# Patient Record
Sex: Female | Born: 1975 | Race: White | Hispanic: No | Marital: Married | Smoking: Never smoker
Health system: Southern US, Community
[De-identification: ages and names within clinical notes are randomized; demographics above are authoritative.]

## PROBLEM LIST (undated history)

## (undated) DIAGNOSIS — R55 Syncope and collapse: Secondary | ICD-10-CM

## (undated) HISTORY — DX: Syncope and collapse: R55

## (undated) HISTORY — PX: LAPAROSCOPY: SHX197

---

## 1999-04-18 ENCOUNTER — Other Ambulatory Visit: Admission: RE | Admit: 1999-04-18 | Discharge: 1999-04-18 | Payer: Self-pay | Admitting: Obstetrics and Gynecology

## 1999-05-05 ENCOUNTER — Encounter (INDEPENDENT_AMBULATORY_CARE_PROVIDER_SITE_OTHER): Payer: Self-pay | Admitting: Specialist

## 1999-05-05 ENCOUNTER — Other Ambulatory Visit: Admission: RE | Admit: 1999-05-05 | Discharge: 1999-05-05 | Payer: Self-pay | Admitting: Obstetrics and Gynecology

## 1999-11-23 ENCOUNTER — Other Ambulatory Visit: Admission: RE | Admit: 1999-11-23 | Discharge: 1999-11-23 | Payer: Self-pay | Admitting: Obstetrics and Gynecology

## 2000-05-16 ENCOUNTER — Other Ambulatory Visit: Admission: RE | Admit: 2000-05-16 | Discharge: 2000-05-16 | Payer: Self-pay | Admitting: Obstetrics and Gynecology

## 2000-12-24 ENCOUNTER — Other Ambulatory Visit: Admission: RE | Admit: 2000-12-24 | Discharge: 2000-12-24 | Payer: Self-pay | Admitting: Obstetrics and Gynecology

## 2001-06-09 ENCOUNTER — Other Ambulatory Visit: Admission: RE | Admit: 2001-06-09 | Discharge: 2001-06-09 | Payer: Self-pay | Admitting: Obstetrics and Gynecology

## 2002-11-24 ENCOUNTER — Other Ambulatory Visit: Admission: RE | Admit: 2002-11-24 | Discharge: 2002-11-24 | Payer: Self-pay | Admitting: Obstetrics and Gynecology

## 2004-10-03 ENCOUNTER — Other Ambulatory Visit: Admission: RE | Admit: 2004-10-03 | Discharge: 2004-10-03 | Payer: Self-pay | Admitting: Obstetrics and Gynecology

## 2005-07-16 ENCOUNTER — Other Ambulatory Visit: Admission: RE | Admit: 2005-07-16 | Discharge: 2005-07-16 | Payer: Self-pay | Admitting: Obstetrics and Gynecology

## 2008-02-03 ENCOUNTER — Ambulatory Visit: Payer: Self-pay | Admitting: Sports Medicine

## 2008-02-03 DIAGNOSIS — M25569 Pain in unspecified knee: Secondary | ICD-10-CM | POA: Insufficient documentation

## 2008-02-03 DIAGNOSIS — M779 Enthesopathy, unspecified: Secondary | ICD-10-CM | POA: Insufficient documentation

## 2008-02-03 DIAGNOSIS — S92909A Unspecified fracture of unspecified foot, initial encounter for closed fracture: Secondary | ICD-10-CM | POA: Insufficient documentation

## 2008-04-15 ENCOUNTER — Emergency Department (HOSPITAL_COMMUNITY): Admission: EM | Admit: 2008-04-15 | Discharge: 2008-04-15 | Payer: Self-pay | Admitting: Emergency Medicine

## 2009-05-02 ENCOUNTER — Ambulatory Visit (HOSPITAL_COMMUNITY): Admission: RE | Admit: 2009-05-02 | Discharge: 2009-05-02 | Payer: Self-pay | Admitting: Obstetrics and Gynecology

## 2009-06-09 ENCOUNTER — Emergency Department (HOSPITAL_COMMUNITY): Admission: EM | Admit: 2009-06-09 | Discharge: 2009-06-09 | Payer: Self-pay | Admitting: Emergency Medicine

## 2010-02-27 ENCOUNTER — Encounter: Admission: RE | Admit: 2010-02-27 | Discharge: 2010-02-27 | Payer: Self-pay | Admitting: Neurology

## 2010-12-05 ENCOUNTER — Ambulatory Visit
Admission: RE | Admit: 2010-12-05 | Discharge: 2010-12-05 | Payer: Self-pay | Source: Home / Self Care | Attending: Sports Medicine | Admitting: Sports Medicine

## 2010-12-05 DIAGNOSIS — M5417 Radiculopathy, lumbosacral region: Secondary | ICD-10-CM | POA: Insufficient documentation

## 2010-12-05 DIAGNOSIS — Q667 Congenital pes cavus, unspecified foot: Secondary | ICD-10-CM | POA: Insufficient documentation

## 2010-12-13 NOTE — Assessment & Plan Note (Signed)
Summary: RUNNER BACK PAIN/MARATHON IN MARCH/MJD   CC:  back and hip pain.  History of Present Illness: back pain and hip pain x1 month.  previously evaluated for MS due to an episode of numbness down bilateral legs, [facility]ist thought it was unlikely.   THat episode ended and hasn't come back.   This time she is having deep achy pain in lower back and left post hip that seems to radiate both down and up.   currently running 30-32mi/wk training for a marathon.    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    Physical Exam  General:  Well-developed,well-nourished,in no acute distress; alert,appropriate and cooperative throughout examination Msk:  Hips: good strenght in abduction and rotation equal leg length good SI joint mobility pain with FABER on left Pain with piriformis stretch TTP deep over left buttocks no numbness  Feet: mortons foot bilaterally high arch normal post tib firing  good running form there is no limp or abnormal foot turnout   Impression & Recommendations:  Problem # 1:  HIP PAIN, LEFT (ICD-719.45) Assessment New  though pt is worried this is related to her endometriosis, more likely to be related to her running.  Likely a piriformis strain.  Gave stretches and exercises for this.  Also noted mortons toes, high arch.  put green temp inserts in shoes.  Gave back stretches/exercises.  Pt to RTC in 4-6 weeks if no improvement.  Consider u/s of muscles at that point.  Orders: Sports Insoles [phone])  Problem # 2:  TALIPES CAVUS (ICD-754.71)  good candidate to keep in some type of cushioned arch support would only use orthotics if pain pattern too  prominent  Orders: Sports Insoles (U0454)   Orders Added: 1)  Sports Insoles [L3510] 2)  New Patient Level II [99202] 3)  Est. Patient Level IV [09811]

## 2011-01-09 ENCOUNTER — Encounter: Payer: Self-pay | Admitting: *Deleted

## 2011-02-10 LAB — POCT I-STAT, CHEM 8
Calcium, Ion: 1.31 mmol/L (ref 1.12–1.32)
Chloride: 104 mEq/L (ref 96–112)
HCT: 42 % (ref 36.0–46.0)
Hemoglobin: 14.3 g/dL (ref 12.0–15.0)
Potassium: 4.3 mEq/L (ref 3.5–5.1)
Sodium: 140 mEq/L (ref 135–145)

## 2011-02-10 LAB — POCT PREGNANCY, URINE: Preg Test, Ur: NEGATIVE

## 2011-05-03 IMAGING — RF DG HYSTEROGRAM
4 series · 4 of 4 positions shown · non-contrast
Comparison: none

CLINICAL DATA: Infertility.

HYSTEROSALPINGOGRAM
TECHNIQUE: Hysterosalpingogram was performed by the ordering
physician under fluoroscopy.  Fluoroscopic images are submitted for
interpretation following the procedure.
Fluoro time:  0.5 minutes

[Series 1: run · 1 of 1 slices shown (1 of 4)]
[im 1/1]
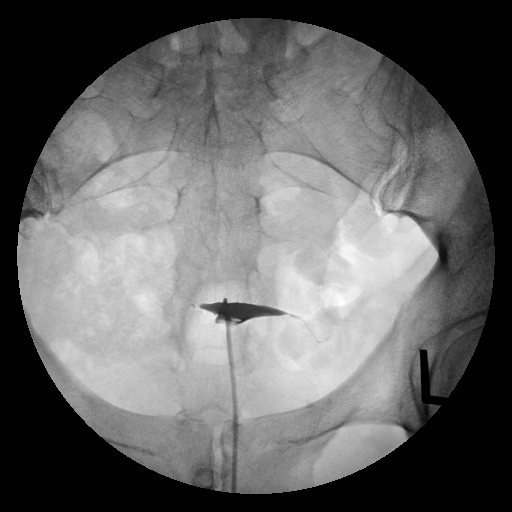

[Series 2: run · 1 of 1 slices shown (2 of 4)]
[im 1/1]
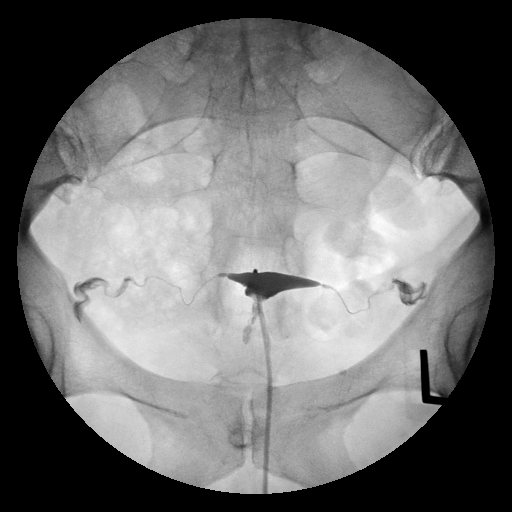

[Series 3: run · 1 of 1 slices shown (3 of 4)]
[im 1/1]
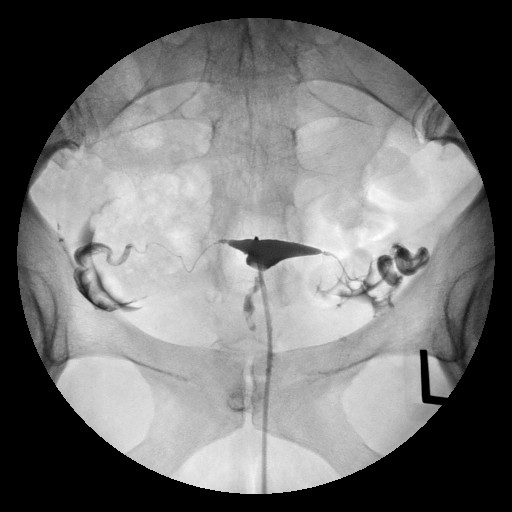

[Series 4: run · 1 of 1 slices shown (4 of 4)]
[im 1/1]
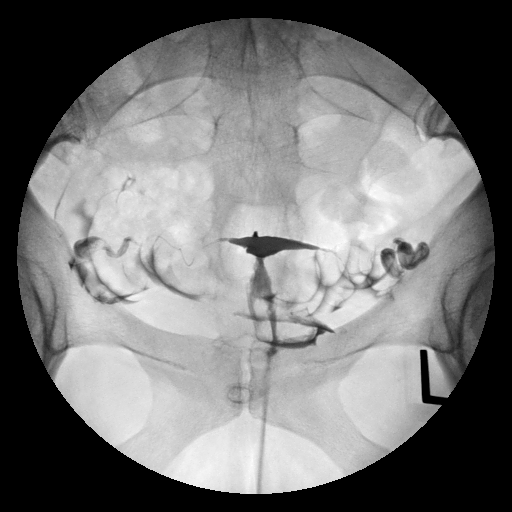

[4 of 4 positions shown; findings below may reference images not displayed]

FINDINGS: The endometrial cavity of the uterus is normal in contour
and appearance.

Contrast filling of both fallopian tubes is seen, and both tubes
are normal in appearance.  Intraperitoneal spill of the contrast
from both fallopian tubes is demonstrated.
IMPRESSION: Normal study.  Fallopian tubes are patent bilaterally.

## 2011-06-05 ENCOUNTER — Encounter: Payer: Self-pay | Admitting: Sports Medicine

## 2011-06-05 ENCOUNTER — Other Ambulatory Visit: Payer: Self-pay | Admitting: Sports Medicine

## 2011-06-05 ENCOUNTER — Ambulatory Visit (INDEPENDENT_AMBULATORY_CARE_PROVIDER_SITE_OTHER): Payer: BC Managed Care – PPO | Admitting: Sports Medicine

## 2011-06-05 VITALS — BP 119/74

## 2011-06-05 DIAGNOSIS — IMO0002 Reserved for concepts with insufficient information to code with codable children: Secondary | ICD-10-CM

## 2011-06-05 DIAGNOSIS — M5417 Radiculopathy, lumbosacral region: Secondary | ICD-10-CM

## 2011-06-05 DIAGNOSIS — M5410 Radiculopathy, site unspecified: Secondary | ICD-10-CM

## 2011-06-05 MED ORDER — GABAPENTIN 300 MG PO CAPS: ORAL_CAPSULE | ORAL | Status: DC

## 2011-06-05 NOTE — Assessment & Plan Note (Addendum)
She's had persistent pain not resolve by stretches and rehabilitation. We'll go ahead and MRI her lumbosacral spine. I will also start Neurontin. She recently finished a prednisone taper for sinus infection, and did not note any improvement in her pain so we'll not repeat the steroids. She's also been through adequate therapy and piriformis stretches without improvement. I will see her back after the MRI is done.

## 2011-06-05 NOTE — Progress Notes (Signed)
  Subjective:    Patient ID: Robyn Wilkins, female    DOB: Jan 18, 1976, 35 y.o.   MRN: [id number]  HPI Exie is a healthy 35 year old female who has had problems with left hip and buttock pain for almost 6 months now. The pain starts in her back and travels to her right buttock and occasionally down the back of her leg to her foot. She also gets some numbness and tingling along the back of her thigh and lower leg. The pain has progressed to the point where it wakes her up at night and she is currently taking Vicodin. She did recently ran a marathon in March, and currently runs 12-15 miles per week. This exacerbates her symptoms. She doesn't note any change in symptoms with bending forward, and has not noted any trauma. She was seen previously diagnosed with a piriformis strain, and has been doing daily rehabilitation stretches and exercises for this. She has not noted any improvement. No change in symptoms with coughing or sneezing.  Of note she did recently finish a course of steroids for a sinus infection, prescribed by another doctor, and has not noticed any improvement in her leg symptoms after this.  Review of Systems No fevers, chills, night sweats, weight loss, bowel or bladder dysfunction.    Objective:   Physical Exam General: Well-developed well-nourished Caucasian female. Back Exam: Inspection: Unremarkable Motion: Flexion 45 deg, Extension 45 deg, Side Bending to 45 deg bilaterally,  Rotation to 45 deg bilaterally SLR laying:  Positive on the left side. XSLR laying: Negative Palpable tenderness: None FABER: negative Adduction stretch test does reproduce some of her symptoms. Sensory change: Gross sensation intact to all lumbar and sacral dermatomes. Reflexes: 2+ and brisk at both patellar tendons, 2+ and brisk at achilles tendons, Babinski's downgoing. Minimal tenderness to palpation at the left sciatic notch. Hip range of motion is 45 of internal/external rotation  bilaterally.  Strength at foot Plantar-flexion: 5/5    Dorsi-flexion: 5/5    Eversion: 5/5   Inversion: 5/5 Leg strength Quad: 5/5   Hamstring: 5/5   Hip flexor: 5/5   Hip abductors: Excellent, 5/5 Gait unremarkable.    Assessment & Plan:   Lumbosacral radiculopathy She's had persistent pain not resolve by stretches and rehabilitation. We'll go ahead and MRI her lumbosacral spine. I will also start Neurontin. She recently finished a prednisone taper for sinus infection, and did not note any improvement in her pain so we'll not repeat the steroids.

## 2011-06-05 NOTE — Patient Instructions (Addendum)
Great to see you, Your appt for the MRI is on Wednesday, aug 8th at 8am at [facility] hospital. Register in admitting at 7:30am. [phone] I will also call in Neurontin. Make an appointment to come back to see [place] after her MRI is done and we can discuss the results.  MRI AUTH # [id number]. APPROVED FOR 30 DAYS

## 2011-06-12 ENCOUNTER — Other Ambulatory Visit: Payer: Self-pay | Admitting: *Deleted

## 2011-06-12 DIAGNOSIS — M5417 Radiculopathy, lumbosacral region: Secondary | ICD-10-CM

## 2011-06-13 ENCOUNTER — Ambulatory Visit (HOSPITAL_COMMUNITY)
Admission: RE | Admit: 2011-06-13 | Discharge: 2011-06-13 | Disposition: A | Discharge status: Home / Self Care | Payer: BC Managed Care – PPO | Source: Ambulatory Visit | Attending: Sports Medicine | Admitting: Sports Medicine

## 2011-06-13 ENCOUNTER — Inpatient Hospital Stay (HOSPITAL_COMMUNITY): Admission: RE | Admit: 2011-06-13 | Payer: BC Managed Care – PPO | Source: Ambulatory Visit

## 2011-06-13 DIAGNOSIS — M545 Low back pain, unspecified: Secondary | ICD-10-CM | POA: Insufficient documentation

## 2011-06-13 DIAGNOSIS — M5417 Radiculopathy, lumbosacral region: Secondary | ICD-10-CM

## 2011-06-13 DIAGNOSIS — M25559 Pain in unspecified hip: Secondary | ICD-10-CM | POA: Insufficient documentation

## 2011-06-21 ENCOUNTER — Ambulatory Visit (INDEPENDENT_AMBULATORY_CARE_PROVIDER_SITE_OTHER): Payer: BC Managed Care – PPO | Admitting: Family Medicine

## 2011-06-21 ENCOUNTER — Encounter: Payer: Self-pay | Admitting: Family Medicine

## 2011-06-21 VITALS — BP 115/74 | HR 65 | Ht 66.0 in | Wt 138.0 lb

## 2011-06-21 DIAGNOSIS — IMO0002 Reserved for concepts with insufficient information to code with codable children: Secondary | ICD-10-CM

## 2011-06-21 DIAGNOSIS — M76899 Other specified enthesopathies of unspecified lower limb, excluding foot: Secondary | ICD-10-CM

## 2011-06-21 DIAGNOSIS — M7062 Trochanteric bursitis, left hip: Secondary | ICD-10-CM

## 2011-06-21 DIAGNOSIS — M5417 Radiculopathy, lumbosacral region: Secondary | ICD-10-CM

## 2011-06-21 DIAGNOSIS — G5700 Lesion of sciatic nerve, unspecified lower limb: Secondary | ICD-10-CM

## 2011-06-21 NOTE — Progress Notes (Signed)
  Subjective:    Patient ID: Robyn Wilkins, female    DOB: 08/26/1976, 35 y.o.   MRN: [id number]  HPI  Robyn Wilkins is a pleasant 35 yo female patient coming today for a F/u on her L spine MRI for  L lumbar radiculopathy. Her MRI showed a mild bulging disc L4-L5 no nerve compression, no spinal stenosis. She states that her radicular symptoms are on and off. Numbness and tingling on and off, improving lately. There is pain on her left gluteal area, radiated to her leg on and off, dull, 4/10, worsen with activities, improve by rest. She has been with this pain for the last 7-8 month. No prior injury. She was given a Rx for neurontin which she did not refill, she wanted to wait for MRI results. She is very active, she works out and exercise frequently. She is doing SI joint stretches. She also is complaining of left posterior mid thigh pain for the last month, she has noticed that her hamstrings are tight  And they burn and hurts when she tries to stretch them. No previous injuries to her hamstring muscle.  Review of Systems  Constitutional: Negative for fever and fatigue.  Musculoskeletal:       Left gluteal pain with HPI  Skin: Negative for color change, pallor and rash.  Neurological: Negative for weakness.       Objective:   Physical Exam  Constitutional: She appears well-developed and well-nourished.       BP 115/74  Pulse 65  Ht 5\' 6"  (1.676 m)  Wt 138 lb (62.596 kg)  BMI 22.27 kg/m2   Eyes: EOM are normal.  Pulmonary/Chest: Effort normal.  Musculoskeletal:       Low back with intact skin. No swelling, no hematomas. FROM for flexion, extension, rotation and lateralization.  No Tenderness to palpation on SI joint area B/L. Faber test negative for SI joint pain. Straight leg raise negative B/L. Strength 5/5 for hip flexion and extension, 5/5 for knee flexion and extension, 5/5 for ankle plantar and dorsal flexion B/L. DTR patellar and achilles II/IV B/L Sensation intact distally  B/L. No leg discrepancy.  Left hip with FROM, TTP in the great  trochanteric area. There is TTP in the left gluteal area over the piriformis path. Abductors strength 5/5 Sensation intact distally.    Neurological: She is alert.  Skin: Skin is warm. No rash noted. No erythema. No pallor.  Psychiatric: She has a normal mood and affect. Judgment normal.   After obtaining consent, the skin of the Left  lateral hip was sterilely prepped with alcohol swabs, ethyl will chloride was used for local topical anesthesia injection of 4 mL 1% lidocaine plus one mL of Kenalog was injected in the left trochanteric bursa. The procedure was well-tolerated by the patient. Patient instructed to remain in clinic for 20 minutes afterwards, and to report any adverse reaction to me immediately.       Assessment & Plan:   1. Lumbosacral radiculopathy   2. Pyriformis syndrome   3. Trochanteric bursitis of left hip    We will see her back in 3 weeks. F/u respond of trochanteric bursa injection Discussed option of neurontin or amytriptiline if she does not improve. Recommended thigh sleeve. Cont SI joint stretches. Cont hamstring stretches. Iliotibial band stretches shown, handout given.

## 2011-07-12 ENCOUNTER — Ambulatory Visit: Payer: BC Managed Care – PPO | Admitting: Family Medicine

## 2011-07-26 ENCOUNTER — Ambulatory Visit (INDEPENDENT_AMBULATORY_CARE_PROVIDER_SITE_OTHER): Payer: BC Managed Care – PPO | Admitting: Family Medicine

## 2011-07-26 VITALS — BP 110/60

## 2011-07-26 DIAGNOSIS — M7062 Trochanteric bursitis, left hip: Secondary | ICD-10-CM

## 2011-07-26 DIAGNOSIS — M76899 Other specified enthesopathies of unspecified lower limb, excluding foot: Secondary | ICD-10-CM

## 2011-07-26 NOTE — Progress Notes (Signed)
  Subjective:    Patient ID: Robyn Wilkins, female    DOB: 1975/11/17, 35 y.o.   MRN: [id number]  HPI  Coming to f/U on her left trochanteric bursitis. She had a cortisone injection done on her left hip which helped her tremendously. She has improve 90%. She is pain free. She has walk with no pain. She went yesterday to the gym to workout without any pain.  Patient Active Problem List  Diagnoses  . Lumbosacral radiculopathy  . TALIPES CAVUS   Current Outpatient Prescriptions on File Prior to Visit  Medication Sig Dispense Refill  . gabapentin (NEURONTIN) 300 MG capsule One tab PO qHS for 3 days, then BID for 3 days, then TID.  90 capsule  1  . LUPRON DEPOT 3.75 MG injection        No Known Allergies   Review of Systems  Constitutional: Negative for fever, chills and fatigue.  Musculoskeletal: Negative for back pain, joint swelling and gait problem.  Neurological: Negative for tremors, seizures, syncope and weakness.       Objective:   Physical Exam  Constitutional: She appears well-developed and well-nourished.       BP 110/60   Eyes: EOM are normal.  Neck: Normal range of motion. Neck supple.  Pulmonary/Chest: Effort normal.  Musculoskeletal:       Left hip with FROM .  No TTP on the trochanteric bursa. No limping. Sensation intact distally  No pain with resisted abduction  Neurological: She is alert.  Skin: Skin is warm. No rash noted. No erythema. No pallor.  Psychiatric: She has a normal mood and affect. Her behavior is normal.          Assessment & Plan:   1. Trochanteric bursitis of left hip   cont. Hip stretches and strengthening exercise. Start low back HEP. F/U PRN

## 2011-08-02 LAB — URINALYSIS, ROUTINE W REFLEX MICROSCOPIC
Glucose, UA: NEGATIVE
Hgb urine dipstick: NEGATIVE
Ketones, ur: 15 — AB
Nitrite: NEGATIVE
Protein, ur: NEGATIVE
Urobilinogen, UA: 0.2
pH: 6

## 2011-08-02 LAB — CBC
HCT: 40.2
Hemoglobin: 13.5
MCV: 89.8
RBC: 4.48

## 2011-08-02 LAB — POCT I-STAT, CHEM 8
BUN: 21
Creatinine, Ser: 1.1
Hemoglobin: 14.3
TCO2: 25

## 2011-08-02 LAB — DIFFERENTIAL
Eosinophils Absolute: 0.1
Eosinophils Relative: 1
Lymphs Abs: 1.7
Monocytes Relative: 10
Neutro Abs: 6.2
Neutrophils Relative %: 70

## 2011-08-02 LAB — POCT CARDIAC MARKERS
CKMB, poc: 1.2
Operator id: 275321
Troponin i, poc: <0.05

## 2011-08-02 LAB — URINE MICROSCOPIC-ADD ON

## 2011-09-07 ENCOUNTER — Encounter (HOSPITAL_COMMUNITY): Payer: Self-pay | Admitting: Anesthesiology

## 2011-09-07 ENCOUNTER — Inpatient Hospital Stay (HOSPITAL_COMMUNITY): Payer: BC Managed Care – PPO

## 2011-09-07 ENCOUNTER — Encounter (HOSPITAL_COMMUNITY)
Admission: AD | Disposition: A | Discharge status: Home / Self Care | Payer: Self-pay | Source: Ambulatory Visit | Attending: Obstetrics and Gynecology

## 2011-09-07 ENCOUNTER — Inpatient Hospital Stay (HOSPITAL_COMMUNITY): Payer: BC Managed Care – PPO | Admitting: Anesthesiology

## 2011-09-07 ENCOUNTER — Ambulatory Visit (HOSPITAL_COMMUNITY)
Admission: AD | Admit: 2011-09-07 | Discharge: 2011-09-07 | Disposition: A | Discharge status: Home / Self Care | Payer: BC Managed Care – PPO | Source: Ambulatory Visit | Attending: Obstetrics and Gynecology | Admitting: Obstetrics and Gynecology

## 2011-09-07 ENCOUNTER — Encounter (HOSPITAL_COMMUNITY): Payer: Self-pay

## 2011-09-07 ENCOUNTER — Other Ambulatory Visit: Payer: Self-pay | Admitting: Obstetrics and Gynecology

## 2011-09-07 DIAGNOSIS — O021 Missed abortion: Principal | ICD-10-CM | POA: Insufficient documentation

## 2011-09-07 HISTORY — PX: DILATION AND EVACUATION: SHX1459

## 2011-09-07 LAB — URINALYSIS, ROUTINE W REFLEX MICROSCOPIC
Bilirubin Urine: NEGATIVE
Nitrite: NEGATIVE
Protein, ur: NEGATIVE mg/dL
Urobilinogen, UA: 0.2 mg/dL (ref 0.0–1.0)
pH: 6 (ref 5.0–8.0)

## 2011-09-07 LAB — CBC
Hemoglobin: 12.4 g/dL (ref 12.0–15.0)
RDW: 12.1 % (ref 11.5–15.5)

## 2011-09-07 LAB — URINE MICROSCOPIC-ADD ON

## 2011-09-07 LAB — POCT PREGNANCY, URINE: Preg Test, Ur: POSITIVE

## 2011-09-07 SURGERY — DILATION AND EVACUATION, UTERUS
Anesthesia: Monitor Anesthesia Care

## 2011-09-07 MED ORDER — CEFAZOLIN SODIUM 1-5 GM-% IV SOLN
1.0000 g | INTRAVENOUS | Status: AC
  Administered 2011-09-07: 1 g via INTRAVENOUS
  Filled 2011-09-07: qty 50

## 2011-09-07 MED ORDER — KETOROLAC TROMETHAMINE 30 MG/ML IJ SOLN: 15.0000 mg | Freq: Once | INTRAMUSCULAR | Status: DC | PRN

## 2011-09-07 MED ORDER — RHO D IMMUNE GLOBULIN 1500 UNIT/2ML IJ SOLN
300.0000 ug | Freq: Once | INTRAMUSCULAR | Status: AC
  Administered 2011-09-07: 300 ug via INTRAMUSCULAR

## 2011-09-07 MED ORDER — KETOROLAC TROMETHAMINE 30 MG/ML IJ SOLN
INTRAMUSCULAR | Status: DC | PRN
  Administered 2011-09-07: 30 mg via INTRAVENOUS

## 2011-09-07 MED ORDER — ONDANSETRON HCL 4 MG/2ML IJ SOLN
INTRAMUSCULAR | Status: DC | PRN
  Administered 2011-09-07: 4 mg via INTRAVENOUS

## 2011-09-07 MED ORDER — PROPOFOL 10 MG/ML IV EMUL
INTRAVENOUS | Status: DC | PRN
  Administered 2011-09-07: 200 ug/kg/min via INTRAVENOUS

## 2011-09-07 MED ORDER — OXYTOCIN 20 UNITS IN LACTATED RINGERS INFUSION - SIMPLE
INTRAVENOUS | Status: DC | PRN
  Administered 2011-09-07: 20 [IU] via INTRAVENOUS

## 2011-09-07 MED ORDER — LACTATED RINGERS IV SOLN
INTRAVENOUS | Status: DC
  Administered 2011-09-07 (×2): via INTRAVENOUS

## 2011-09-07 MED ORDER — KETOROLAC TROMETHAMINE 60 MG/2ML IM SOLN
60.0000 mg | Freq: Once | INTRAMUSCULAR | Status: AC
  Administered 2011-09-07: 60 mg via INTRAMUSCULAR
  Filled 2011-09-07: qty 2

## 2011-09-07 MED ORDER — MIDAZOLAM HCL 5 MG/5ML IJ SOLN
INTRAMUSCULAR | Status: DC | PRN
  Administered 2011-09-07: 2 mg via INTRAVENOUS

## 2011-09-07 MED ORDER — FAMOTIDINE IN NACL 20-0.9 MG/50ML-% IV SOLN
20.0000 mg | Freq: Once | INTRAVENOUS | Status: AC
  Administered 2011-09-07: 20 mg via INTRAVENOUS
  Filled 2011-09-07: qty 50

## 2011-09-07 MED ORDER — METHYLERGONOVINE MALEATE 0.2 MG PO TABS: 0.2000 mg | ORAL_TABLET | Freq: Three times a day (TID) | ORAL | Status: AC

## 2011-09-07 MED ORDER — CITRIC ACID-SODIUM CITRATE 334-500 MG/5ML PO SOLN
30.0000 mL | Freq: Once | ORAL | Status: AC
  Administered 2011-09-07: 30 mL via ORAL
  Filled 2011-09-07: qty 15

## 2011-09-07 MED ORDER — LIDOCAINE HCL (CARDIAC) 20 MG/ML IV SOLN
INTRAVENOUS | Status: DC | PRN
  Administered 2011-09-07: 50 mg via INTRAVENOUS

## 2011-09-07 MED ORDER — FENTANYL CITRATE 0.05 MG/ML IJ SOLN
INTRAMUSCULAR | Status: DC | PRN
  Administered 2011-09-07: 100 ug via INTRAVENOUS

## 2011-09-07 MED ORDER — FENTANYL CITRATE 0.05 MG/ML IJ SOLN: 25.0000 ug | INTRAMUSCULAR | Status: DC | PRN

## 2011-09-07 MED ORDER — LIDOCAINE HCL 1 % IJ SOLN
INTRAMUSCULAR | Status: DC | PRN
  Administered 2011-09-07: 20 mL

## 2011-09-07 MED ORDER — DEXAMETHASONE SODIUM PHOSPHATE 10 MG/ML IJ SOLN
INTRAMUSCULAR | Status: DC | PRN
  Administered 2011-09-07: 10 mg via INTRAVENOUS

## 2011-09-07 SURGICAL SUPPLY — 18 items
CATH ROBINSON RED A/P 16FR (CATHETERS) ×2 IMPLANT
CLOTH BEACON ORANGE TIMEOUT ST (SAFETY) ×2 IMPLANT
DECANTER SPIKE VIAL GLASS SM (MISCELLANEOUS) ×2 IMPLANT
DRAPE UTILITY XL STRL (DRAPES) ×2 IMPLANT
GLOVE BIO SURGEON STRL SZ8 (GLOVE) ×4 IMPLANT
GOWN PREVENTION PLUS LG XLONG (DISPOSABLE) ×2 IMPLANT
KIT BERKELEY 1ST TRIMESTER 3/8 (MISCELLANEOUS) ×2 IMPLANT
NEEDLE SPNL 22GX3.5 QUINCKE BK (NEEDLE) ×2 IMPLANT
NS IRRIG 1000ML POUR BTL (IV SOLUTION) ×2 IMPLANT
PACK VAGINAL MINOR WOMEN LF (CUSTOM PROCEDURE TRAY) ×2 IMPLANT
PAD PREP 24X48 CUFFED NSTRL (MISCELLANEOUS) ×2 IMPLANT
SET BERKELEY SUCTION TUBING (SUCTIONS) ×2 IMPLANT
SYR CONTROL 10ML LL (SYRINGE) ×2 IMPLANT
TOWEL OR 17X24 6PK STRL BLUE (TOWEL DISPOSABLE) ×4 IMPLANT
VACURETTE 10 RIGID CVD (CANNULA) IMPLANT
VACURETTE 7MM CVD STRL WRAP (CANNULA) ×2 IMPLANT
VACURETTE 8 RIGID CVD (CANNULA) IMPLANT
VACURETTE 9 RIGID CVD (CANNULA) IMPLANT

## 2011-09-07 NOTE — Consult Note (Cosign Needed)
Consulted with Dr. Henderson Cloud regarding pt HPI/exam/med history/labs/ultrasound>will talk with pt in MAU room and discuss options.

## 2011-09-07 NOTE — Anesthesia Postprocedure Evaluation (Signed)
Anesthesia Post Note  Patient: Robyn Wilkins  Procedure(s) Performed:  DILATATION AND EVACUATION (D&E)  Anesthesia type: MAC  Patient location: PACU  Post pain: Pain level controlled  Post assessment: Post-op Vital signs reviewed  Last Vitals:  Filed Vitals:   09/07/11 2200  BP: 105/60  Pulse: 65  Temp: 37 C  Resp: 16    Post vital signs: Reviewed  Level of consciousness: sedated  Complications: No apparent anesthesia complications

## 2011-09-07 NOTE — Progress Notes (Signed)
H&P 35 yo G1P0 S/P L/S for endometriosis about 2 years ago. Had recurrent pain and was treated with Depo Lupron , last injection first of July.  No regular menses yet.  Has had fatigue and breast tenderness. Had heavy bleeding x 5 minutes today. Now has heavy bleeding in BR and now some in bed. No N/V, Fever.  PMH syncopal episodes with extensive cardio/neuro evaluation. Followed by episode of bilateral leg numbness, again neuro evaluation inconclusive.  No sxs in about 1 year.  PSH L/S  Meds Vicodin prn NKDA All gluten  PE Blood pressure 97/65, pulse 68, temperature 99.1 F (37.3 C), temperature source Oral, resp. rate 18, height 5' 6.5" (1.689 m), weight 61.803 kg (136 lb 4 oz), last menstrual period 11/06/2010.  Lungs CTA Cor RRR Cx closed per NP check  U/S c/w 6 6/7 weeks, no FHT  Results for orders placed during the hospital encounter of 09/07/11 (from the past 24 hour(s))  URINALYSIS, ROUTINE W REFLEX MICROSCOPIC     Status: Abnormal   Collection Time   09/07/11  4:12 PM      Component Value Range   Color, Urine YELLOW  YELLOW    Appearance HAZY (*) CLEAR    Specific Gravity, Urine 1.020  1.005 - 1.030    pH 6.0  5.0 - 8.0    Glucose, UA NEGATIVE  NEGATIVE (mg/dL)   Hgb urine dipstick LARGE (*) NEGATIVE    Bilirubin Urine NEGATIVE  NEGATIVE    Ketones, ur NEGATIVE  NEGATIVE (mg/dL)   Protein, ur NEGATIVE  NEGATIVE (mg/dL)   Urobilinogen, UA 0.2  0.0 - 1.0 (mg/dL)   Nitrite NEGATIVE  NEGATIVE    Leukocytes, UA NEGATIVE  NEGATIVE   URINE MICROSCOPIC-ADD ON     Status: Abnormal   Collection Time   09/07/11  4:12 PM      Component Value Range   Squamous Epithelial / LPF RARE  RARE    WBC, UA 0-2  <3 (WBC/hpf)   RBC / HPF TOO NUMEROUS TO COUNT  <3 (RBC/hpf)   Bacteria, UA FEW (*) RARE   POCT PREGNANCY, URINE     Status: Normal   Collection Time   09/07/11  4:19 PM      Component Value Range   Preg Test, Ur POSITIVE    CBC     Status: Abnormal   Collection Time   09/07/11  5:17 PM      Component Value Range   WBC 12.9 (*) 4.0 - 10.5 (K/uL)   RBC 4.07  3.87 - 5.11 (MIL/uL)   Hemoglobin 12.4  12.0 - 15.0 (g/dL)   HCT 16.1  09.6 - 04.5 (%)   MCV 89.7  78.0 - 100.0 (fL)   MCH 30.5  26.0 - 34.0 (pg)   MCHC 34.0  30.0 - 36.0 (g/dL)   RDW 40.9  81.1 - 91.4 (%)   Platelets 218  150 - 400 (K/uL)  HCG, QUANTITATIVE, PREGNANCY     Status: Abnormal   Collection Time   09/07/11  5:17 PM      Component Value Range   hCG, Beta Chain, Quant, S 21299 (*) <5 (mIU/mL)  ABO/RH     Status: Normal   Collection Time   09/07/11  5:18 PM      Component Value Range   ABO/RH(D) O NEG    A: SAB P: D/W pt/husband above and options-expectant management and D&E. Pt elects D&E. Reviewed procedure and risks-infection, uterine perforation with organ damage  and L/S or laparotomy, bleeding with transfusion with HIV/Hep risk, DVT/PE, pneumonia, IU synechiae and secondary infertility. All questions answered.

## 2011-09-07 NOTE — Transfer of Care (Signed)
Immediate Anesthesia Transfer of Care Note  Patient: Robyn Wilkins  Procedure(s) Performed:  DILATATION AND EVACUATION (D&E)  Patient Location: PACU  Anesthesia Type: MAC  Level of Consciousness: oriented and sedated  Airway & Oxygen Therapy: Patient Spontanous Breathing and Patient connected to nasal cannula oxygen  Post-op Assessment: Report given to PACU RN and Post -op Vital signs reviewed and stable  Post vital signs: stable  Complications: No apparent anesthesia complications

## 2011-09-07 NOTE — ED Provider Notes (Signed)
History     Chief Complaint  Patient presents with  . Vaginal Bleeding   HPI  Pt reports that two weeks ago she experienced bleeding and it was like "turning on a faucet", then it stopped within 5 min.  Put on Lupron q month from Feb- July for endometriosis. Bleeding started again on Wed, 09/05/11; bleeding is described  like a normal period;  Passed two orange size clots and one golf ball size clot.  +cramping in lower abdomen; bleeding is increasing at this time.      Past Medical History  Diagnosis Date  . Endometriosis     Past Surgical History  Procedure Date  . Laparoscopy     for endometriosis    No family history on file.  History  Substance Use Topics  . Smoking status: Never Smoker   . Smokeless tobacco: Not on file  . Alcohol Use: Yes     occassional    Allergies: No Known Allergies  Prescriptions prior to admission  Medication Sig Dispense Refill  . gabapentin (NEURONTIN) 300 MG capsule One tab PO qHS for 3 days, then BID for 3 days, then TID.  90 capsule  1  . LUPRON DEPOT 3.75 MG injection         Review of Systems  Gastrointestinal: Positive for abdominal pain.  Genitourinary:       Vaginal bleeding  All other systems reviewed and are negative.   Physical Exam   Blood pressure 97/65, pulse 68, temperature 99.1 F (37.3 C), temperature source Oral, resp. rate 18, height 5' 6.5" (1.689 m), weight 61.803 kg (136 lb 4 oz), last menstrual period 11/06/2010.  Physical Exam  Constitutional: She is oriented to person, place, and time. She appears well-developed and well-nourished.       Uncomfortable appearing  HENT:  Head: Normocephalic.  Neck: Normal range of motion. Neck supple.  Cardiovascular: Normal rate, regular rhythm and normal heart sounds.  Exam reveals no gallop and no friction rub.   No murmur heard. Respiratory: Effort normal and breath sounds normal. No respiratory distress.  GI: Soft. She exhibits no mass. There is tenderness  (suprapubic). There is no rebound and no guarding.  Genitourinary: There is bleeding (moderate; +clots) around the vagina.       Cervical os - closed  Neurological: She is alert and oriented to person, place, and time.  Skin: Skin is warm and dry.    MAU Course  Procedures  ABO/RH: O neg BHCG: 21299 CBC: 12.4  Pelvic US IMPRESSION:  Single intrauterine gestation, 6 weeks 6 days by crown-rump length.  No cardiac activity is currently visualized. Recommend close  interval follow-up.  3.7 cm left fundal fibroid.  Assessment and Plan  Report given to Dr. Henderson Cloud who assumes care of patient.  [facility] 09/07/2011, 4:30 PM

## 2011-09-07 NOTE — Anesthesia Preprocedure Evaluation (Addendum)
Anesthesia Evaluation  Patient identified by MRN, date of birth, ID band Patient awake    Reviewed: Allergy & Precautions, H&P , NPO status , Patient's Chart, lab work & pertinent test results, reviewed documented beta blocker date and time   History of Anesthesia Complications Negative for: history of anesthetic complications  Airway Mallampati: I TM Distance: >3 FB Neck ROM: full    Dental  (+) Teeth Intact   Pulmonary neg pulmonary ROS,  clear to auscultation  Pulmonary exam normal       Cardiovascular Exercise Tolerance: Good neg cardio ROS regular Normal    Neuro/Psych Negative Neurological ROS  Negative Psych ROS   GI/Hepatic negative GI ROS, Neg liver ROS,   Endo/Other  Negative Endocrine ROS  Renal/GU negative Renal ROS  Genitourinary negative   Musculoskeletal   Abdominal   Peds  Hematology negative hematology ROS (+)   Anesthesia Other Findings   Reproductive/Obstetrics negative OB ROS (+) Pregnancy (6wk missed ab)                           Anesthesia Physical Anesthesia Plan  ASA: I and Emergent  Anesthesia Plan: MAC   Post-op Pain Management:    Induction:   Airway Management Planned:   Additional Equipment:   Intra-op Plan:   Post-operative Plan:   Informed Consent: I have reviewed the patients History and Physical, chart, labs and discussed the procedure including the risks, benefits and alternatives for the proposed anesthesia with the patient or authorized representative who has indicated his/her understanding and acceptance.   Dental Advisory Given  Plan Discussed with: CRNA and Surgeon  Anesthesia Plan Comments:         Anesthesia Quick Evaluation

## 2011-09-07 NOTE — Brief Op Note (Signed)
09/07/2011  8:50 PM  PATIENT:  Robyn Wilkins  35 y.o. female G1P0 with 6 6/7 week MAB  PRE-OPERATIVE DIAGNOSIS:  Missed Abortion  POST-OPERATIVE DIAGNOSIS:  Missed Abortion  PROCEDURE:  Procedure(s): DILATATION AND EVACUATION (D&E)  SURGEON:  Surgeon(s): Roselle Locus II  PHYSICIAN ASSISTANT:   ASSISTANTS: none   ANESTHESIA:   MAC  EBL:  Total I/O In: 1000 [I.V.:1000] Out: -   BLOOD ADMINISTERED:none  DRAINS: none   LOCAL MEDICATIONS USED:  LIDOCAINE 20CC  SPECIMEN:  Source of Specimen:  POC  DISPOSITION OF SPECIMEN:  PATHOLOGY  COUNTS:  YES  TOURNIQUET:  * No tourniquets in log *  DICTATION: .Other Dictation: Dictation Number [id number]  PLAN OF CARE: Discharge to home after PACU  PATIENT DISPOSITION:  PACU - hemodynamically stable.   Delay start of Pharmacological VTE agent (>24hrs) due to surgical blood loss or risk of bleeding:  not applicable

## 2011-09-07 NOTE — Progress Notes (Signed)
Pt states, ": Two weeks ago I had bleeding and it was light turning on a faucet, then it stopped within 5 min. I was on Lupron from Feb- July for endometriosis. Bleeding started again on Wed like a normal period, and then I passed two orange size clots adf one golf ball size clot and had cramping in my ,lower abdomen. I took a percocet at 1:30 pm."

## 2011-09-08 LAB — RH IG WORKUP (INCLUDES ABO/RH): Unit division: 0

## 2011-09-08 NOTE — Op Note (Signed)
Robyn Wilkins, Robyn Wilkins               ACCOUNT NO.:  [id number]  MEDICAL RECORD NO.:  [id number]  LOCATION:  [facility]                          FACILITY:  [facility]  PHYSICIAN:  Guy Sandifer. Henderson Cloud, M.D. DATE OF BIRTH:  Jan 04, 1976  DATE OF PROCEDURE:  09/07/2011 DATE OF DISCHARGE:  09/07/2011                              OPERATIVE REPORT   PREOPERATIVE DIAGNOSIS:  Missed abortion.  POSTOPERATIVE DIAGNOSIS:  Missed abortion.  PROCEDURE:  Dilatation and evacuation.  SURGEON:  Guy Sandifer. Henderson Cloud, MD  ANESTHESIA:  MAC, Dana Allan, MD  ESTIMATED BLOOD LOSS:  Less than 100 mL.  SPECIMENS:  Products of conception to Pathology.  INDICATIONS AND CONSENT:  The patient is a 35 year old married white female, G1, P0, at 45 and 6/7th weeks with a missed abortion.  Details are in the history and physical.  After discussion of options, dilatation and evacuation was reviewed.  Potential risks and complications were reviewed preoperatively including not limited to infection, uterine perforation, organ damage, laparoscopy/laparotomy, bleeding requiring transfusion of blood products with HIV and hepatitis acquisition, DVT, PE, pneumonia, intrauterine synechia, and secondary infertility.  All questions were answered and consent signed on the chart.  PROCEDURE:  The patient was taken to the operating room where she was identified, placed in dorsal supine position, and given intravenous sedation.  She was placed in a dorsal lithotomy position.  Time-out undertaken.  She was prepped, bladder straight catheterized, and draped in a sterile fashion.  Examination revealed the cervix to be dilated at 1 cm.  Uterus was about 7 weeks in size.  Bivalve speculum was placed in the vagina.  Anterior cervical lip was injected with 1% Xylocaine and grasped with single-tooth tenaculum.  Paracervical block was placed at 2, 4, 5, 7, 8, and 10 o'clock positions with approximately 20 mL of the same solution.  Cervix was gently  progressively dilated and really required little dilation.  It readily passes a #7 curved curette and suction curettage was carried out for products of conception. Alternating sharp and suction curettage are gently done until the cavity is clean.  20 units of Pitocin per L of IV fluid was started after initial pass of the suction curette.  Cavity was clean.  Good hemostasis was noted.  Instruments were removed.  All counts were correct.  The patient was awakened, taken to recovery room in stable condition.  Blood type was Rh negative and RhoGAM will be given.  She will be discharged home on Methergine 0.2 mg, #6 one p.o. t.i.d. and followup is in the office in 2 weeks with Dr. Henderson Cloud.     [name], M.D.     JET/MEDQ  D:  09/07/2011  T:  09/08/2011  Job:  [id number]

## 2011-09-10 ENCOUNTER — Encounter (HOSPITAL_COMMUNITY): Payer: Self-pay | Admitting: Obstetrics and Gynecology

## 2013-01-22 ENCOUNTER — Encounter: Payer: Self-pay | Admitting: Cardiology

## 2013-08-26 ENCOUNTER — Telehealth: Payer: Self-pay | Admitting: Genetic Counselor

## 2013-08-26 NOTE — Telephone Encounter (Signed)
[facility] FOR PT TO RETURN CALL IN RE TO REFERRAL.  °

## 2013-09-02 ENCOUNTER — Other Ambulatory Visit: Payer: Self-pay | Admitting: Obstetrics and Gynecology

## 2013-09-02 DIAGNOSIS — Z803 Family history of malignant neoplasm of breast: Secondary | ICD-10-CM

## 2014-08-23 ENCOUNTER — Other Ambulatory Visit: Payer: Self-pay | Admitting: Dermatology

## 2014-08-25 ENCOUNTER — Other Ambulatory Visit: Payer: Self-pay | Admitting: Obstetrics and Gynecology

## 2014-08-26 LAB — CYTOLOGY - PAP

## 2015-11-22 ENCOUNTER — Other Ambulatory Visit: Payer: Self-pay | Admitting: Nurse Practitioner

## 2015-11-22 DIAGNOSIS — N63 Unspecified lump in unspecified breast: Secondary | ICD-10-CM

## 2015-11-30 ENCOUNTER — Ambulatory Visit
Admission: RE | Admit: 2015-11-30 | Discharge: 2015-11-30 | Disposition: A | Discharge status: Home / Self Care | Payer: BC Managed Care – PPO | Source: Ambulatory Visit | Attending: Nurse Practitioner | Admitting: Nurse Practitioner

## 2015-11-30 DIAGNOSIS — N63 Unspecified lump in unspecified breast: Secondary | ICD-10-CM

## 2015-12-08 LAB — OB RESULTS CONSOLE RUBELLA ANTIBODY, IGM: Rubella: IMMUNE

## 2015-12-08 LAB — OB RESULTS CONSOLE HIV ANTIBODY (ROUTINE TESTING): HIV: NONREACTIVE

## 2015-12-08 LAB — OB RESULTS CONSOLE HEPATITIS B SURFACE ANTIGEN: Hepatitis B Surface Ag: NEGATIVE

## 2015-12-08 LAB — OB RESULTS CONSOLE GC/CHLAMYDIA
CHLAMYDIA, DNA PROBE: NEGATIVE
Gonorrhea: NEGATIVE

## 2015-12-08 LAB — OB RESULTS CONSOLE RPR: RPR: NONREACTIVE

## 2016-06-19 LAB — OB RESULTS CONSOLE GBS: STREP GROUP B AG: POSITIVE

## 2016-07-14 ENCOUNTER — Inpatient Hospital Stay (HOSPITAL_COMMUNITY): Payer: BC Managed Care – PPO | Admitting: Anesthesiology

## 2016-07-14 ENCOUNTER — Inpatient Hospital Stay (HOSPITAL_COMMUNITY)
Admission: RE | Admit: 2016-07-14 | Discharge: 2016-07-14 | Disposition: A | Discharge status: Home / Self Care | Payer: BC Managed Care – PPO | Source: Ambulatory Visit | Attending: Obstetrics and Gynecology | Admitting: Obstetrics and Gynecology

## 2016-07-14 ENCOUNTER — Encounter (HOSPITAL_COMMUNITY): Payer: Self-pay | Admitting: *Deleted

## 2016-07-14 ENCOUNTER — Inpatient Hospital Stay (HOSPITAL_COMMUNITY)
Admission: AD | Admit: 2016-07-14 | Discharge: 2016-07-18 | DRG: 766 | Disposition: A | Discharge status: Home / Self Care | Payer: BC Managed Care – PPO | Source: Ambulatory Visit | Attending: Obstetrics and Gynecology | Admitting: Obstetrics and Gynecology

## 2016-07-14 DIAGNOSIS — Z3A39 39 weeks gestation of pregnancy: Secondary | ICD-10-CM

## 2016-07-14 DIAGNOSIS — Z3490 Encounter for supervision of normal pregnancy, unspecified, unspecified trimester: Secondary | ICD-10-CM

## 2016-07-14 DIAGNOSIS — M5416 Radiculopathy, lumbar region: Secondary | ICD-10-CM | POA: Diagnosis present

## 2016-07-14 LAB — CBC
HEMATOCRIT: 35.4 % — AB (ref 36.0–46.0)
Hemoglobin: 12.2 g/dL (ref 12.0–15.0)
MCH: 30.8 pg (ref 26.0–34.0)
MCHC: 34.5 g/dL (ref 30.0–36.0)
MCV: 89.4 fL (ref 78.0–100.0)
PLATELETS: 192 10*3/uL (ref 150–400)
RBC: 3.96 MIL/uL (ref 3.87–5.11)
RDW: 13.3 % (ref 11.5–15.5)
WBC: 8 10*3/uL (ref 4.0–10.5)

## 2016-07-14 MED ORDER — TERBUTALINE SULFATE 1 MG/ML IJ SOLN: 0.2500 mg | Freq: Once | INTRAMUSCULAR | Status: DC | PRN

## 2016-07-14 MED ORDER — EPHEDRINE 5 MG/ML INJ: 10.0000 mg | INTRAVENOUS | Status: DC | PRN

## 2016-07-14 MED ORDER — SOD CITRATE-CITRIC ACID 500-334 MG/5ML PO SOLN
30.0000 mL | ORAL | Status: DC | PRN
  Administered 2016-07-15: 30 mL via ORAL
  Filled 2016-07-14: qty 15

## 2016-07-14 MED ORDER — OXYTOCIN 40 UNITS IN LACTATED RINGERS INFUSION - SIMPLE MED
1.0000 m[IU]/min | INTRAVENOUS | Status: DC
  Administered 2016-07-14: 2 m[IU]/min via INTRAVENOUS
  Administered 2016-07-15: 10 m[IU]/min via INTRAVENOUS
  Filled 2016-07-14: qty 1000

## 2016-07-14 MED ORDER — LACTATED RINGERS IV SOLN
500.0000 mL | Freq: Once | INTRAVENOUS | Status: AC
  Administered 2016-07-14: 500 mL via INTRAVENOUS

## 2016-07-14 MED ORDER — OXYTOCIN 40 UNITS IN LACTATED RINGERS INFUSION - SIMPLE MED: 2.5000 [IU]/h | INTRAVENOUS | Status: DC

## 2016-07-14 MED ORDER — LACTATED RINGERS IV SOLN: INTRAVENOUS | Status: DC

## 2016-07-14 MED ORDER — PHENYLEPHRINE 40 MCG/ML (10ML) SYRINGE FOR IV PUSH (FOR BLOOD PRESSURE SUPPORT)
80.0000 ug | PREFILLED_SYRINGE | INTRAVENOUS | Status: DC | PRN
  Filled 2016-07-14: qty 10

## 2016-07-14 MED ORDER — LACTATED RINGERS IV SOLN
INTRAVENOUS | Status: DC
  Administered 2016-07-14 (×2): via INTRAVENOUS

## 2016-07-14 MED ORDER — OXYCODONE-ACETAMINOPHEN 5-325 MG PO TABS: 1.0000 | ORAL_TABLET | ORAL | Status: DC | PRN

## 2016-07-14 MED ORDER — ACETAMINOPHEN 325 MG PO TABS: 650.0000 mg | ORAL_TABLET | ORAL | Status: DC | PRN

## 2016-07-14 MED ORDER — LACTATED RINGERS IV SOLN
500.0000 mL | INTRAVENOUS | Status: DC | PRN
  Administered 2016-07-14: 1000 mL via INTRAVENOUS

## 2016-07-14 MED ORDER — OXYTOCIN BOLUS FROM INFUSION: 500.0000 mL | Freq: Once | INTRAVENOUS | Status: DC

## 2016-07-14 MED ORDER — FENTANYL 2.5 MCG/ML BUPIVACAINE 1/10 % EPIDURAL INFUSION (WH - ANES)
14.0000 mL/h | INTRAMUSCULAR | Status: DC | PRN
  Administered 2016-07-14 (×2): 14 mL/h via EPIDURAL
  Filled 2016-07-14 (×2): qty 125

## 2016-07-14 MED ORDER — ONDANSETRON HCL 4 MG/2ML IJ SOLN
4.0000 mg | Freq: Four times a day (QID) | INTRAMUSCULAR | Status: DC | PRN
  Administered 2016-07-14: 4 mg via INTRAVENOUS
  Filled 2016-07-14: qty 2

## 2016-07-14 MED ORDER — FLEET ENEMA 7-19 GM/118ML RE ENEM: 1.0000 | ENEMA | RECTAL | Status: DC | PRN

## 2016-07-14 MED ORDER — DIPHENHYDRAMINE HCL 50 MG/ML IJ SOLN: 12.5000 mg | INTRAMUSCULAR | Status: DC | PRN

## 2016-07-14 MED ORDER — OXYCODONE-ACETAMINOPHEN 5-325 MG PO TABS: 2.0000 | ORAL_TABLET | ORAL | Status: DC | PRN

## 2016-07-14 MED ORDER — LIDOCAINE HCL (PF) 1 % IJ SOLN
INTRAMUSCULAR | Status: DC | PRN
  Administered 2016-07-14 (×2): 4 mL via EPIDURAL

## 2016-07-14 MED ORDER — LIDOCAINE HCL (PF) 1 % IJ SOLN: 30.0000 mL | INTRAMUSCULAR | Status: DC | PRN

## 2016-07-14 MED ORDER — PHENYLEPHRINE 40 MCG/ML (10ML) SYRINGE FOR IV PUSH (FOR BLOOD PRESSURE SUPPORT): 80.0000 ug | PREFILLED_SYRINGE | INTRAVENOUS | Status: DC | PRN

## 2016-07-14 MED ORDER — DEXTROSE 5 % IV SOLN
5.0000 10*6.[IU] | Freq: Once | INTRAVENOUS | Status: AC
  Administered 2016-07-14: 5 10*6.[IU] via INTRAVENOUS
  Filled 2016-07-14: qty 5

## 2016-07-14 MED ORDER — PENICILLIN G POTASSIUM 5000000 UNITS IJ SOLR
2.5000 10*6.[IU] | INTRAVENOUS | Status: DC
  Administered 2016-07-14 (×2): 2.5 10*6.[IU] via INTRAVENOUS
  Filled 2016-07-14 (×7): qty 2.5

## 2016-07-14 NOTE — Anesthesia Procedure Notes (Signed)
Epidural Patient location during procedure: OB Start time: 07/14/2016 1:24 PM  Staffing Anesthesiologist: Mal AmabileFOSTER, Zaviyar Rahal Performed: anesthesiologist   Preanesthetic Checklist Completed: patient identified, site marked, surgical consent, pre-op evaluation, timeout performed, IV checked, risks and benefits discussed and monitors and equipment checked  Epidural Patient position: sitting Prep: site prepped and draped and DuraPrep Patient monitoring: continuous pulse ox and blood pressure Approach: midline Location: L3-L4 Injection technique: LOR air  Needle:  Needle type: Tuohy  Needle gauge: 17 G Needle length: 9 cm and 9 Needle insertion depth: 5 cm cm Catheter type: closed end flexible Catheter size: 19 Gauge Catheter at skin depth: 10 cm Test dose: negative and Other  Assessment Events: blood not aspirated, injection not painful, no injection resistance, negative IV test and no paresthesia  Additional Notes Patient identified. Risks and benefits discussed including failed block, incomplete  Pain control, post dural puncture headache, nerve damage, paralysis, blood pressure Changes, nausea, vomiting, reactions to medications-both toxic and allergic and post Partum back pain. All questions were answered. Patient expressed understanding and wished to proceed. Sterile technique was used throughout procedure. Epidural site was Dressed with sterile barrier dressing. No paresthesias, signs of intravascular injection Or signs of intrathecal spread were encountered.  Patient was more comfortable after the epidural was dosed. Please see RN's note for documentation of vital signs and FHR which are stable.

## 2016-07-14 NOTE — Anesthesia Preprocedure Evaluation (Addendum)
Anesthesia Evaluation  Patient identified by MRN, date of birth, ID band Patient awake    Reviewed: Allergy & Precautions, Patient's Chart, lab work & pertinent test results  Airway Mallampati: III  TM Distance: >3 FB Neck ROM: Full    Dental no notable dental hx. (+) Teeth Intact   Pulmonary    Pulmonary exam normal breath sounds clear to auscultation       Cardiovascular negative cardio ROS Normal cardiovascular exam Rhythm:Regular Rate:Normal     Neuro/Psych  Neuromuscular disease negative psych ROS   GI/Hepatic negative GI ROS, Neg liver ROS,   Endo/Other  negative endocrine ROS  Renal/GU negative Renal ROS  negative genitourinary   Musculoskeletal Lumbosacral radiculopathy   Abdominal   Peds  Hematology negative hematology ROS (+)   Anesthesia Other Findings   Reproductive/Obstetrics (+) Pregnancy Hx/o endometriosis                             Lab Results  Component Value Date   WBC 8.0 07/14/2016   HGB 12.2 07/14/2016   HCT 35.4 (L) 07/14/2016   MCV 89.4 07/14/2016   PLT 192 07/14/2016    Anesthesia Physical Anesthesia Plan  ASA: II and emergent  Anesthesia Plan: Epidural   Post-op Pain Management:    Induction:   Airway Management Planned: Natural Airway  Additional Equipment:   Intra-op Plan:   Post-operative Plan:   Informed Consent: I have reviewed the patients History and Physical, chart, labs and discussed the procedure including the risks, benefits and alternatives for the proposed anesthesia with the patient or authorized representative who has indicated his/her understanding and acceptance.   Dental advisory given  Plan Discussed with: Anesthesiologist, CRNA and Surgeon  Anesthesia Plan Comments: (Patient for C/Section for arrest of descent. Will use epidural for C/Section. M. Malen GauzeFoster, MD)       Anesthesia Quick Evaluation

## 2016-07-14 NOTE — Anesthesia Pain Management Evaluation Note (Signed)
  [facility]NA Pain Management Visit Note  Patient: Robyn Wilkins, 40 y.o., female  "Hello I am a member of the anesthesia team at [facility]. We have an anesthesia team available at all times to provide care throughout the hospital, including epidural management and anesthesia for C-section. I don't know your plan for the delivery whether it a natural birth, water birth, IV sedation, nitrous supplementation, doula or epidural, but we want to meet your pain goals."   1.Was your pain managed to your expectations on prior hospitalizations?   Yes   2.What is your expectation for pain management during this hospitalization?     Epidural  3.How can we help you reach that goal? Support as needed  Record the patient's initial score and the patient's pain goal.   Pain: 0  Pain Goal: 3 [facility] wants you to be able to say your pain was always managed very well.  [facility] CenterWRINKLE,Robyn Wilkins 07/14/2016

## 2016-07-14 NOTE — H&P (Signed)
Nelida Goresmanda Worm is a 40 y.o. female presenting for IOL at term. Prenatal care complicated by AMA with normal Panorama. OB History    Gravida Para Term Preterm AB Living   1 0 0 0 0 0   SAB TAB Ectopic Multiple Live Births   0 0 0 0       Past Medical History:  Diagnosis Date  . Endometriosis   . 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 Urinary incontinence    Past Surgical History:  Procedure Laterality Date  . DILATION AND EVACUATION  09/07/2011   Procedure: DILATATION AND EVACUATION (D&E);  Surgeon: Roselle LocusJames E Jahkari Maclin II;  Location: [facility];  Service: Gynecology;  Laterality: N/A;  . LAPAROSCOPY     for endometriosis   Family History: family history is not on file. Social History:  reports that she has never smoked. She does not have any smokeless tobacco history on file. She reports that she drinks alcohol. She reports that she does not use drugs.     Maternal Diabetes: No Genetic Screening: Normal Maternal Ultrasounds/Referrals: Normal Fetal Ultrasounds or other Referrals:  None Maternal Substance Abuse:  No Significant Maternal Medications:  None Significant Maternal Lab Results:  None Other Comments:  None  Review of Systems  Constitutional: Negative for fever.  Eyes: Negative for blurred vision.  Gastrointestinal: Negative for abdominal pain.  Neurological: Negative for headaches.   Maternal Medical History:  Fetal activity: Perceived fetal activity is normal.    Prenatal Complications - Diabetes: none.    Dilation: 2 Effacement (%): 50 Station: -3 Exam by:: S Earl RNC Blood pressure 94/61, pulse 69, temperature 98 F (36.7 C), temperature source Oral, resp. rate 15, height 5\' 6"  (1.676 m), weight 164 lb (74.4 kg), last menstrual period 10/14/2015. Maternal Exam:  Uterine Assessment: Contraction strength is mild.  Contraction frequency is irregular.   Abdomen: Patient reports no  abdominal tenderness. Fetal presentation: vertex     Fetal Exam Fetal State Assessment: Category I - tracings are normal.     Physical Exam  Cardiovascular: Normal rate and regular rhythm.   Respiratory: Effort normal and breath sounds normal.  GI: Soft. There is no tenderness.  Neurological: She has normal reflexes.    Patient very uncomfortable with cervical exam Cx 2/50/-3/vtx  Prenatal labs: ABO, Rh: --/--/O NEG (09/09 0830) Antibody: POS (09/09 0830) Rubella:   RPR:    HBsAg:    HIV:    GBS:     Assessment/Plan: 40 yo G1P0 @ 39 weeks D/W patient induction Will start pitocin   Tiasia Weberg II,Ayaka Andes E 07/14/2016, 9:31 AM

## 2016-07-14 NOTE — Progress Notes (Signed)
Report recv'd, care assumed 

## 2016-07-14 NOTE — Progress Notes (Signed)
Epidural in UCs q2-4 min Pitocin on FHT cat one Cx 3/50/-2 AROM clear

## 2016-07-15 ENCOUNTER — Encounter (HOSPITAL_COMMUNITY): Payer: Self-pay | Admitting: Student

## 2016-07-15 ENCOUNTER — Encounter (HOSPITAL_COMMUNITY)
Admission: AD | Disposition: A | Discharge status: Home / Self Care | Payer: Self-pay | Source: Ambulatory Visit | Attending: Obstetrics and Gynecology

## 2016-07-15 LAB — CBC
HEMATOCRIT: 31.9 % — AB (ref 36.0–46.0)
HEMOGLOBIN: 11 g/dL — AB (ref 12.0–15.0)
MCH: 30.7 pg (ref 26.0–34.0)
MCHC: 34.5 g/dL (ref 30.0–36.0)
MCV: 89.1 fL (ref 78.0–100.0)
Platelets: 174 10*3/uL (ref 150–400)
RBC: 3.58 MIL/uL — ABNORMAL LOW (ref 3.87–5.11)
RDW: 13.2 % (ref 11.5–15.5)
WBC: 20.8 10*3/uL — AB (ref 4.0–10.5)

## 2016-07-15 LAB — RPR: RPR Ser Ql: NONREACTIVE

## 2016-07-15 SURGERY — Surgical Case
Anesthesia: Epidural | Site: Abdomen

## 2016-07-15 MED ORDER — LIDOCAINE-EPINEPHRINE (PF) 2 %-1:200000 IJ SOLN
INTRAMUSCULAR | Status: AC
  Filled 2016-07-15: qty 20

## 2016-07-15 MED ORDER — EPHEDRINE SULFATE-NACL 50-0.9 MG/10ML-% IV SOSY
PREFILLED_SYRINGE | INTRAVENOUS | Status: DC | PRN
  Administered 2016-07-15: 15 mg via INTRAVENOUS

## 2016-07-15 MED ORDER — SODIUM CHLORIDE 0.9 % IR SOLN
Status: DC | PRN
  Administered 2016-07-15: 1000 mL

## 2016-07-15 MED ORDER — MEPERIDINE HCL 25 MG/ML IJ SOLN: 6.2500 mg | INTRAMUSCULAR | Status: DC | PRN

## 2016-07-15 MED ORDER — LACTATED RINGERS IV SOLN
INTRAVENOUS | Status: DC | PRN
  Administered 2016-07-15: 03:00:00 via INTRAVENOUS

## 2016-07-15 MED ORDER — NALBUPHINE HCL 10 MG/ML IJ SOLN: 5.0000 mg | INTRAMUSCULAR | Status: DC | PRN

## 2016-07-15 MED ORDER — OXYCODONE HCL 5 MG PO TABS
5.0000 mg | ORAL_TABLET | ORAL | Status: DC | PRN
  Administered 2016-07-16 – 2016-07-18 (×5): 5 mg via ORAL
  Filled 2016-07-15 (×5): qty 1

## 2016-07-15 MED ORDER — DEXAMETHASONE SODIUM PHOSPHATE 10 MG/ML IJ SOLN
INTRAMUSCULAR | Status: DC | PRN
  Administered 2016-07-15: 10 mg via INTRAVENOUS

## 2016-07-15 MED ORDER — MENTHOL 3 MG MT LOZG: 1.0000 | LOZENGE | OROMUCOSAL | Status: DC | PRN

## 2016-07-15 MED ORDER — OXYTOCIN 10 UNIT/ML IJ SOLN
INTRAMUSCULAR | Status: DC | PRN
  Administered 2016-07-15: 40 [IU] via INTRAVENOUS

## 2016-07-15 MED ORDER — SODIUM BICARBONATE 8.4 % IV SOLN
INTRAVENOUS | Status: DC | PRN
  Administered 2016-07-15 (×4): 5 mL via EPIDURAL

## 2016-07-15 MED ORDER — PROMETHAZINE HCL 25 MG/ML IJ SOLN
INTRAMUSCULAR | Status: AC
  Filled 2016-07-15: qty 1

## 2016-07-15 MED ORDER — ACETAMINOPHEN 500 MG PO TABS
1000.0000 mg | ORAL_TABLET | Freq: Four times a day (QID) | ORAL | Status: AC
  Administered 2016-07-15 (×3): 1000 mg via ORAL
  Filled 2016-07-15 (×3): qty 2

## 2016-07-15 MED ORDER — NALBUPHINE HCL 10 MG/ML IJ SOLN: 5.0000 mg | Freq: Once | INTRAMUSCULAR | Status: DC | PRN

## 2016-07-15 MED ORDER — CEFAZOLIN SODIUM-DEXTROSE 2-3 GM-% IV SOLR
INTRAVENOUS | Status: DC | PRN
  Administered 2016-07-15: 2 g via INTRAVENOUS

## 2016-07-15 MED ORDER — MEPERIDINE HCL 25 MG/ML IJ SOLN
INTRAMUSCULAR | Status: AC
  Filled 2016-07-15: qty 1

## 2016-07-15 MED ORDER — NALOXONE HCL 0.4 MG/ML IJ SOLN: 0.4000 mg | INTRAMUSCULAR | Status: DC | PRN

## 2016-07-15 MED ORDER — DIPHENHYDRAMINE HCL 50 MG/ML IJ SOLN: 12.5000 mg | INTRAMUSCULAR | Status: DC | PRN

## 2016-07-15 MED ORDER — PROMETHAZINE HCL 25 MG/ML IJ SOLN
INTRAMUSCULAR | Status: DC | PRN
  Administered 2016-07-15: 5 mg via INTRAVENOUS

## 2016-07-15 MED ORDER — SENNOSIDES-DOCUSATE SODIUM 8.6-50 MG PO TABS
2.0000 | ORAL_TABLET | ORAL | Status: DC
  Administered 2016-07-15 – 2016-07-17 (×3): 2 via ORAL
  Filled 2016-07-15 (×3): qty 2

## 2016-07-15 MED ORDER — MORPHINE SULFATE (PF) 0.5 MG/ML IJ SOLN
INTRAMUSCULAR | Status: AC
  Filled 2016-07-15: qty 10

## 2016-07-15 MED ORDER — SIMETHICONE 80 MG PO CHEW: 80.0000 mg | CHEWABLE_TABLET | ORAL | Status: DC | PRN

## 2016-07-15 MED ORDER — IBUPROFEN 600 MG PO TABS: 600.0000 mg | ORAL_TABLET | Freq: Four times a day (QID) | ORAL | Status: DC | PRN

## 2016-07-15 MED ORDER — ZOLPIDEM TARTRATE 5 MG PO TABS: 5.0000 mg | ORAL_TABLET | Freq: Every evening | ORAL | Status: DC | PRN

## 2016-07-15 MED ORDER — CEFAZOLIN SODIUM-DEXTROSE 2-4 GM/100ML-% IV SOLN
INTRAVENOUS | Status: AC
  Filled 2016-07-15: qty 100

## 2016-07-15 MED ORDER — SODIUM CHLORIDE 0.9% FLUSH: 3.0000 mL | INTRAVENOUS | Status: DC | PRN

## 2016-07-15 MED ORDER — ONDANSETRON HCL 4 MG/2ML IJ SOLN
INTRAMUSCULAR | Status: DC | PRN
  Administered 2016-07-15: 4 mg via INTRAVENOUS

## 2016-07-15 MED ORDER — SODIUM BICARBONATE 8.4 % IV SOLN
INTRAVENOUS | Status: AC
  Filled 2016-07-15: qty 50

## 2016-07-15 MED ORDER — IBUPROFEN 600 MG PO TABS
600.0000 mg | ORAL_TABLET | Freq: Four times a day (QID) | ORAL | Status: DC
  Administered 2016-07-15 – 2016-07-18 (×11): 600 mg via ORAL
  Filled 2016-07-15 (×11): qty 1

## 2016-07-15 MED ORDER — FENTANYL CITRATE (PF) 100 MCG/2ML IJ SOLN: 25.0000 ug | INTRAMUSCULAR | Status: DC | PRN

## 2016-07-15 MED ORDER — SIMETHICONE 80 MG PO CHEW
80.0000 mg | CHEWABLE_TABLET | ORAL | Status: DC
  Administered 2016-07-15 – 2016-07-17 (×3): 80 mg via ORAL
  Filled 2016-07-15 (×3): qty 1

## 2016-07-15 MED ORDER — WITCH HAZEL-GLYCERIN EX PADS: 1.0000 "application " | MEDICATED_PAD | CUTANEOUS | Status: DC | PRN

## 2016-07-15 MED ORDER — OXYTOCIN 40 UNITS IN LACTATED RINGERS INFUSION - SIMPLE MED: 2.5000 [IU]/h | INTRAVENOUS | Status: AC

## 2016-07-15 MED ORDER — ACETAMINOPHEN 325 MG PO TABS
650.0000 mg | ORAL_TABLET | ORAL | Status: DC | PRN
  Administered 2016-07-16 – 2016-07-18 (×7): 650 mg via ORAL
  Filled 2016-07-15 (×8): qty 2

## 2016-07-15 MED ORDER — DIBUCAINE 1 % RE OINT: 1.0000 "application " | TOPICAL_OINTMENT | RECTAL | Status: DC | PRN

## 2016-07-15 MED ORDER — DEXAMETHASONE SODIUM PHOSPHATE 10 MG/ML IJ SOLN
INTRAMUSCULAR | Status: AC
  Filled 2016-07-15: qty 1

## 2016-07-15 MED ORDER — COCONUT OIL OIL
1.0000 "application " | TOPICAL_OIL | Status: DC | PRN
  Administered 2016-07-17: 1 via TOPICAL
  Filled 2016-07-15: qty 120

## 2016-07-15 MED ORDER — LACTATED RINGERS IV SOLN
INTRAVENOUS | Status: DC
  Administered 2016-07-15: 12:00:00 via INTRAVENOUS

## 2016-07-15 MED ORDER — DIPHENHYDRAMINE HCL 25 MG PO CAPS: 25.0000 mg | ORAL_CAPSULE | ORAL | Status: DC | PRN

## 2016-07-15 MED ORDER — MEPERIDINE HCL 25 MG/ML IJ SOLN
INTRAMUSCULAR | Status: DC | PRN
  Administered 2016-07-15: 12.5 mg via INTRAVENOUS

## 2016-07-15 MED ORDER — TETANUS-DIPHTH-ACELL PERTUSSIS 5-2.5-18.5 LF-MCG/0.5 IM SUSP: 0.5000 mL | Freq: Once | INTRAMUSCULAR | Status: DC

## 2016-07-15 MED ORDER — EPHEDRINE 5 MG/ML INJ
INTRAVENOUS | Status: AC
  Filled 2016-07-15: qty 10

## 2016-07-15 MED ORDER — LACTATED RINGERS IV SOLN
INTRAVENOUS | Status: DC | PRN
  Administered 2016-07-15 (×2): via INTRAVENOUS

## 2016-07-15 MED ORDER — DIPHENHYDRAMINE HCL 25 MG PO CAPS
25.0000 mg | ORAL_CAPSULE | Freq: Four times a day (QID) | ORAL | Status: DC | PRN
Start: 2016-07-15 — End: 2016-07-18
  Administered 2016-07-15: 25 mg via ORAL
  Filled 2016-07-15: qty 1

## 2016-07-15 MED ORDER — PRENATAL MULTIVITAMIN CH
1.0000 | ORAL_TABLET | Freq: Every day | ORAL | Status: DC
  Administered 2016-07-15 – 2016-07-17 (×3): 1 via ORAL
  Filled 2016-07-15 (×3): qty 1

## 2016-07-15 MED ORDER — ONDANSETRON HCL 4 MG/2ML IJ SOLN: 4.0000 mg | Freq: Three times a day (TID) | INTRAMUSCULAR | Status: DC | PRN

## 2016-07-15 MED ORDER — ONDANSETRON HCL 4 MG/2ML IJ SOLN
INTRAMUSCULAR | Status: AC
  Filled 2016-07-15: qty 2

## 2016-07-15 MED ORDER — SODIUM CHLORIDE 0.9 % IJ SOLN
INTRAMUSCULAR | Status: AC
  Filled 2016-07-15: qty 10

## 2016-07-15 MED ORDER — SCOPOLAMINE 1 MG/3DAYS TD PT72
MEDICATED_PATCH | TRANSDERMAL | Status: DC | PRN
  Administered 2016-07-15: 1 via TRANSDERMAL

## 2016-07-15 MED ORDER — KETOROLAC TROMETHAMINE 30 MG/ML IJ SOLN: 30.0000 mg | Freq: Four times a day (QID) | INTRAMUSCULAR | Status: AC | PRN

## 2016-07-15 MED ORDER — OXYTOCIN 10 UNIT/ML IJ SOLN
INTRAMUSCULAR | Status: AC
  Filled 2016-07-15: qty 4

## 2016-07-15 MED ORDER — KETOROLAC TROMETHAMINE 30 MG/ML IJ SOLN
30.0000 mg | Freq: Four times a day (QID) | INTRAMUSCULAR | Status: AC | PRN
  Administered 2016-07-15: 30 mg via INTRAVENOUS
  Filled 2016-07-15: qty 1

## 2016-07-15 MED ORDER — MORPHINE SULFATE (PF) 0.5 MG/ML IJ SOLN
INTRAMUSCULAR | Status: DC | PRN
  Administered 2016-07-15: 4 mg via EPIDURAL

## 2016-07-15 MED ORDER — SIMETHICONE 80 MG PO CHEW
80.0000 mg | CHEWABLE_TABLET | Freq: Three times a day (TID) | ORAL | Status: DC
  Administered 2016-07-15 – 2016-07-17 (×8): 80 mg via ORAL
  Filled 2016-07-15 (×10): qty 1

## 2016-07-15 MED ORDER — OXYCODONE HCL 5 MG PO TABS
10.0000 mg | ORAL_TABLET | ORAL | Status: DC | PRN
  Administered 2016-07-16 (×2): 10 mg via ORAL
  Filled 2016-07-15 (×2): qty 2

## 2016-07-15 MED ORDER — NALOXONE HCL 2 MG/2ML IJ SOSY
1.0000 ug/kg/h | PREFILLED_SYRINGE | INTRAVENOUS | Status: DC | PRN
  Filled 2016-07-15: qty 2

## 2016-07-15 SURGICAL SUPPLY — 37 items
BENZOIN TINCTURE PRP APPL 2/3 (GAUZE/BANDAGES/DRESSINGS) ×3 IMPLANT
CHLORAPREP W/TINT 26ML (MISCELLANEOUS) ×3 IMPLANT
CLAMP CORD UMBIL (MISCELLANEOUS) IMPLANT
CLOSURE WOUND 1/2 X4 (GAUZE/BANDAGES/DRESSINGS) ×1
CLOTH BEACON ORANGE TIMEOUT ST (SAFETY) ×3 IMPLANT
CONTAINER PREFILL 10% NBF 15ML (MISCELLANEOUS) IMPLANT
DRSG OPSITE POSTOP 4X10 (GAUZE/BANDAGES/DRESSINGS) ×3 IMPLANT
ELECT REM PT RETURN 9FT ADLT (ELECTROSURGICAL) ×3
ELECTRODE REM PT RTRN 9FT ADLT (ELECTROSURGICAL) ×1 IMPLANT
EXTRACTOR VACUUM M CUP 4 TUBE (SUCTIONS) IMPLANT
EXTRACTOR VACUUM M CUP 4' TUBE (SUCTIONS)
GLOVE BIO SURGEON STRL SZ8 (GLOVE) ×3 IMPLANT
GLOVE BIOGEL PI IND STRL 7.0 (GLOVE) ×2 IMPLANT
GLOVE BIOGEL PI INDICATOR 7.0 (GLOVE) ×4
GOWN STRL REUS W/TWL LRG LVL3 (GOWN DISPOSABLE) ×6 IMPLANT
KIT ABG SYR 3ML LUER SLIP (SYRINGE) ×3 IMPLANT
LIQUID BAND (GAUZE/BANDAGES/DRESSINGS) IMPLANT
NEEDLE HYPO 25X5/8 SAFETYGLIDE (NEEDLE) ×3 IMPLANT
NS IRRIG 1000ML POUR BTL (IV SOLUTION) ×3 IMPLANT
PACK C SECTION WH (CUSTOM PROCEDURE TRAY) ×3 IMPLANT
PAD ABD 7.5X8 STRL (GAUZE/BANDAGES/DRESSINGS) ×3 IMPLANT
PAD OB MATERNITY 4.3X12.25 (PERSONAL CARE ITEMS) ×3 IMPLANT
PENCIL SMOKE EVAC W/HOLSTER (ELECTROSURGICAL) ×3 IMPLANT
SPONGE DRAIN TRACH 4X4 STRL 2S (GAUZE/BANDAGES/DRESSINGS) ×3 IMPLANT
SPONGE SURGIFOAM ABS GEL 12-7 (HEMOSTASIS) ×3 IMPLANT
STRIP CLOSURE SKIN 1/2X4 (GAUZE/BANDAGES/DRESSINGS) ×2 IMPLANT
SUT CHROMIC 2 0 SH (SUTURE) ×3 IMPLANT
SUT MNCRL 0 VIOLET CTX 36 (SUTURE) ×4 IMPLANT
SUT MONOCRYL 0 CTX 36 (SUTURE) ×8
SUT PDS AB 0 CTX 60 (SUTURE) ×3 IMPLANT
SUT PLAIN 0 NONE (SUTURE) IMPLANT
SUT PLAIN 2 0 (SUTURE) ×2
SUT PLAIN 2 0 XLH (SUTURE) IMPLANT
SUT PLAIN ABS 2-0 CT1 27XMFL (SUTURE) ×1 IMPLANT
SUT VIC AB 4-0 KS 27 (SUTURE) ×3 IMPLANT
TOWEL OR 17X24 6PK STRL BLUE (TOWEL DISPOSABLE) ×3 IMPLANT
TRAY FOLEY CATH SILVER 14FR (SET/KITS/TRAYS/PACK) IMPLANT

## 2016-07-15 NOTE — Progress Notes (Signed)
Dr. Henderson Cloudomblin notified of saturated honeycomb dressing and redness/?allergic reaction on patients face. He will be by to assess patient shortly.

## 2016-07-15 NOTE — Anesthesia Postprocedure Evaluation (Signed)
Anesthesia Post Note  Patient: Robyn Wilkins  Procedure(s) Performed: Procedure(s) (LRB): CESAREAN SECTION (N/A)  Patient location during evaluation: Mother Baby Anesthesia Type: Epidural Level of consciousness: awake and alert Pain management: satisfactory to patient Vital Signs Assessment: post-procedure vital signs reviewed and stable Respiratory status: respiratory function stable Cardiovascular status: stable Postop Assessment: no headache, no backache, epidural receding, patient able to bend at knees, no signs of nausea or vomiting and adequate PO intake Anesthetic complications: no     Last Vitals:  Vitals:   07/15/16 0745 07/15/16 0850  BP: 135/74 122/63  Pulse: 68 73  Resp: 16 16  Temp: 36.8 C 37.2 C    Last Pain:  Vitals:   07/15/16 0850  TempSrc: Oral  PainSc: 2    Pain Goal: Patients Stated Pain Goal: 3 (07/15/16 0850)               Karleen DolphinFUSSELL,Jasey Cortez

## 2016-07-15 NOTE — Op Note (Signed)
[name]:  Wilkins, Robyn               ACCOUNT NO.:  [id number]  MEDICAL RECORD NO.:  [id number]  LOCATION:  [facility]                          FACILITY:  [facility]  PHYSICIAN:  Guy SandiferJames E. Henderson Cloudomblin, M.D. DATE OF BIRTH:  Apr 06, 1976  DATE OF PROCEDURE:  07/15/2016 DATE OF DISCHARGE:                              OPERATIVE REPORT   PREOPERATIVE DIAGNOSIS:  Arrest of descent.  POSTOPERATIVE DIAGNOSES:  Arrest of descent.  PROCEDURE:  Low-transverse cesarean section.  SURGEON:  Guy SandiferJames E. Henderson Cloudomblin, M.D.  ANESTHESIA:  Epidural, Robyn PouMichael A Foster, MD.  ESTIMATED BLOOD LOSS:  600 mL.  SPECIMENS:  Placenta to Pathology.  FINDINGS:  Viable female infant.  Apgars, arterial cord pH, birth weight pending.  INDICATIONS AND CONSENT:  This patient is a 40 year old, G1, P0, at 4139- 2/7th weeks, who is admitted for induction of labor.  She progresses to complete and pushing and after 2 hours of pushing with good effort fails to descend beyond her starting point of +1 station.  Diagnosis of arrest of descent was made.  Recommendation for cesarean section was discussed. Potential risks and complications were reviewed preoperatively including, but not limited to infection, organ damage, bleeding requiring transfusion of blood products with HIV and hepatitis acquisition, DVT, PE, and pneumonia.  All questions were answered.  The patient states she understands and agrees and consent is signed on the chart.  DESCRIPTION OF PROCEDURE:  The patient was taken to the operating room where she was identified and her epidural anesthetic was augmented to the surgical level.  She was placed in a dorsal supine position with a 15-degree left lateral wedge.  Foley catheter was in place.  She was prepped and drap[facility] per [facility] protocol.  Time-out was undertaken.  After testing for adequate epidural anesthesia, skin was entered through a Pfannenstiel incision and dissection was carried out in layers of the peritoneum.   Peritoneum was then incised extended superiorly and inferiorly.  Vesicouterine peritoneum was taken down cephalad laterally.  Bladder flap developed, the bladder blade was placed.  Uterus was incised in a low transverse manner and the uterine cavity was entered bluntly with a hemostat.  The uterine incision was extended cephalad laterally with fingers.  The vertex was then delivered from the occiput posterior position.  Remainder of the baby was delivered and good cry and tone was noted.  Cord was clamped and cut. The baby was handed to awaiting pediatrics team.  Placenta was manually delivered and sent to Pathology.  Uterine cavity was clean.  An extension on the left angle of the uterine incision down towards the cervix was noted.  The uterus was closed in 2 running locking imbricating layers of 0 Monocryl suture.  In order to facilitate proper visualization and obtain complete hemostasis, the uterus was exteriorized.  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  Ovaries were normal.  A 2-0 chromic was then used to repair and oversew the area of laceration with careful attention paid to the posterior aspect of the uterine serosa.  Uterus was then returned to the cavity.  A portion of Gelfoam was then placed over this area.  The anterior peritoneum was closed in a running fashion with  0 Monocryl suture, which was also used to repair of the pyramidalis muscle in midline.  The anterior rectus fascia was then closed in a running fashion with a 0 looped PDS suture. Subcutaneous layer was closed with interrupted plain and the skin was closed in a subcuticular fashion with 4-0 Vicryl on a Keith needle. Benzoin, Steri-Strips, pressure dressing were applied.  All counts correct.  The patient was taken to the recovery room in stable condition.     [name], M.D.     JET/MEDQ  D:  07/15/2016  T:  07/15/2016  Job:  [id number]

## 2016-07-15 NOTE — Progress Notes (Signed)
Pushing x 2 hours with good effort Cx C/C/+1 no change over the 2 hours FHT about 1.5 hours ago late decelerations noted     Position change, oxygen administration and reduction in pitocin done-now some variable decels  Maternal temperature = 99  A/P: arrest of descent Recommend cesarean section. D/W patient risks including infection, organ damage, bleeding/transfusion-HIV/Hep, DVT/PE, pneumonia. All questions answered Patient states she understands and agrees

## 2016-07-15 NOTE — Brief Op Note (Signed)
07/14/2016 - 07/15/2016  3:30 AM  PATIENT:  Robyn Wilkins  40 y.o. female  PRE-OPERATIVE DIAGNOSIS:  arrest of descent   POST-OPERATIVE DIAGNOSIS:  arrest of descent   PROCEDURE:  Procedure(s): CESAREAN SECTION (N/A)  SURGEON:  Surgeon(s) and Role:    * Harold HedgeJames Boleslaw Borghi, MD - Primary  PHYSICIAN ASSISTANT:   ASSISTANTS: none   ANESTHESIA:   epidural  EBL:  Total I/O In: 2000 [I.V.:2000] Out: 1900 [Urine:850; Emesis/NG output:450; Blood:600]  BLOOD ADMINISTERED:none  DRAINS: Urinary Catheter (Foley)   LOCAL MEDICATIONS USED:  NONE  SPECIMEN:  Source of Specimen:  placenta  DISPOSITION OF SPECIMEN:  PATHOLOGY  COUNTS:  YES  TOURNIQUET:  * No tourniquets in log *  DICTATION: .Other Dictation: Dictation Number [id number]  PLAN OF CARE: Admit to inpatient   PATIENT DISPOSITION:  PACU - hemodynamically stable.   Delay start of Pharmacological VTE agent (>24hrs) due to surgical blood loss or risk of bleeding: not applicable

## 2016-07-15 NOTE — Lactation Note (Signed)
This note was copied from a baby's chart. Lactation Consultation Note  Patient Name: Robyn Wilkins AVWUJ'WToday's Date: 07/15/2016 Reason for consult: Initial assessment;Difficult latch (Difficult latch on right breast.)  Baby 17 hours old. Mom reports that baby is having more difficulty latching to right breast. Mom's nipple is everted, but shaft shorter on right nipple than left nipple. Mom's breasts are small and difficult to compress. Mom states that she has been nursing in cross-cradle position. Assisted mom with latching baby to right breast in football position. Assisted mom with hand expression, but no colostrum flowing. Parents report that they have seen colostrum earlier. Discussed progression of milk coming to volume. Assisted mom to compress right breast to attempt to latch baby, and mom pulled baby and swore stating that the right breast is sore/sensitive. Apologized for hurting mom and assured mom that this LC would not touch her breast, but would talk her through latching the baby. After several attempts, mom stated that she did not think the baby could latch to right breast. Enc mom to reposition her right hand to support the baby at the base of his head. Pointed out the bruising on the baby's head and discussed how this position is more comfortable for the baby than holding his head and also allows his head to lean back so he leads onto the breast with his chin--to achieve a deeper latch. Enc mom to wait for the baby to open his mouth and then pull him on with her right hand. Mom waited and then pulled the baby on and the baby latched deeply. Demonstrated to FOB how to flange lower lip and mom reported increased comfort.   Enc FOB to hold the baby's hand while mom attempting to latch and mom stated that the FOB would not always be with her when she was nursing. Discussed with mom that the tips and encouragements that this LC was discussing were for today/this evening. Discussed how it often  takes 4 hands to latch a baby in the first few days of the baby's life. FOB also enc mom to just focus on today. Mom seemed to relax after baby nursing for a few minutes at right breast and she reported that she was no longer having any discomfort.   Enc nursing with cues, and mom given LC brochure. Mom is aware of OP/BFSG and LC phone line assistance after D/C.   Maternal Data Has patient been taught Hand Expression?: Yes Does the patient have breastfeeding experience prior to this delivery?: No  Feeding Feeding Type: Breast Fed Length of feed: 15 min  LATCH Score/Interventions Latch: Grasps breast easily, tongue down, lips flanged, rhythmical sucking.  Audible Swallowing: A few with stimulation  Type of Nipple: Everted at rest and after stimulation  Comfort (Breast/Nipple): Filling, red/small blisters or bruises, mild/mod discomfort (Right nipple sore/sensitive.)  Problem noted: Mild/Moderate discomfort Interventions (Mild/moderate discomfort): Hand expression  Hold (Positioning): Assistance needed to correctly position infant at breast and maintain latch. Intervention(s): Breastfeeding basics reviewed;Support Pillows;Position options;Skin to skin  LATCH Score: 7  Lactation Tools Discussed/Used     Consult Status Consult Status: Follow-up Date: 07/16/16 Follow-up type: In-patient    Sherlyn HayJennifer D Jaymian Bogart 07/15/2016, 8:35 PM

## 2016-07-15 NOTE — Consult Note (Signed)
Neonatology Note:   Attendance at C-section:    I was asked by Dr. Henderson Cloudomblin to attend this primary C/S at term for arrest of descent with decels. The mother is a G1, GBS + with good prenatal care. ROM 14 hours before delivery with aIAP, fluid clear. Infant vigorous with good spontaneous cry and tone. Needed only minimal bulb suctioning. Ap 8/9. Lungs clear to ausc in DR. To CN to care of Pediatrician.  Dineen Kidavid C. Leary RocaEhrmann, MD

## 2016-07-15 NOTE — Addendum Note (Signed)
Addendum  created 07/15/16 [id number] by Graciela HusbandsWynn O Emad Brechtel, CRNA   Sign clinical note

## 2016-07-15 NOTE — Anesthesia Postprocedure Evaluation (Signed)
Anesthesia Post Note  Patient: Robyn Wilkins  Procedure(s) Performed: Procedure(s) (LRB): CESAREAN SECTION (N/A)  Patient location during evaluation: PACU Anesthesia Type: Epidural Level of consciousness: awake and alert and oriented Pain management: pain level controlled Vital Signs Assessment: post-procedure vital signs reviewed and stable Respiratory status: spontaneous breathing, nonlabored ventilation and respiratory function stable Cardiovascular status: blood pressure returned to baseline and stable Postop Assessment: no headache, no backache, epidural receding, patient able to bend at knees and no signs of nausea or vomiting Anesthetic complications: no     Last Vitals:  Vitals:   07/15/16 0455 07/15/16 0503  BP:  118/61  Pulse: 72 67  Resp: 14 16  Temp:  36.9 C    Last Pain:  Vitals:   07/15/16 0430  TempSrc:   PainSc: 0-No pain   Pain Goal: Patients Stated Pain Goal: 6 (07/14/16 1201)               Jaspreet Bodner A.

## 2016-07-15 NOTE — Transfer of Care (Signed)
Immediate Anesthesia Transfer of Care Note  Patient: Nelida Goresmanda Jeffcoat  Procedure(s) Performed: Procedure(s): CESAREAN SECTION (N/A)  Patient Location: PACU  Anesthesia Type:Epidural  Level of Consciousness: awake  Airway & Oxygen Therapy: Patient Spontanous Breathing  Post-op Assessment: Report given to RN and Post -op Vital signs reviewed and stable  Post vital signs: stable  Last Vitals:  Vitals:   07/15/16 0216 07/15/16 0347  BP: 112/67 113/76  Pulse: 84 91  Resp: 18 13  Temp:      Last Pain:  Vitals:   07/15/16 0158  TempSrc: Oral  PainSc:       Patients Stated Pain Goal: 6 (07/14/16 1201)  Complications: No apparent anesthesia complications

## 2016-07-15 NOTE — Progress Notes (Signed)
CTSP: re rash and dressing red. 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 C/O itchy rash on abdomen and face. No SOB, no cough.  VSS Afeb Lungs CTA Face erythematous on cheeks Abdomen fine reticular rash above dressing Dressing with some blood. Dressing removed. No active bleeding. Incision well opposed with no induration, no masses. New dressing and pressure dressing applied.  Benadryl

## 2016-07-15 NOTE — Progress Notes (Signed)
Subjective: Postpartum Day 0: Cesarean Delivery Patient reports tolerating PO.    Objective: Vital signs in last 24 hours: Temp:  [97.8 F (36.6 C)-99 F (37.2 C)] 98.3 F (36.8 C) (09/10 0745) Pulse Rate:  [51-101] 68 (09/10 0745) Resp:  [13-23] 16 (09/10 0745) BP: (88-135)/(43-84) 135/74 (09/10 0745) SpO2:  [92 %-100 %] 100 % (09/10 0745)  Physical Exam:  General: alert, cooperative and no distress Lochia: appropriate Uterine Fundus: firm Incision: healing well DVT Evaluation: No evidence of DVT seen on physical exam.   Recent Labs  07/14/16 0830 07/15/16 0718  HGB 12.2 11.0*  HCT 35.4* 31.9*    Assessment/Plan: Status post Cesarean section. Doing well postoperatively.  Continue current care.  Maliyah Willets II,Keyondra Lagrand E 07/15/2016, 8:45 AM

## 2016-07-16 NOTE — Progress Notes (Signed)
Subjective: Postpartum Day 1: Cesarean Delivery Patient reports incisional pain, tolerating PO and no problems voiding.    Objective: Vital signs in last 24 hours: Temp:  [98.6 F (37 C)-99 F (37.2 C)] 98.6 F (37 C) (09/11 0641) Pulse Rate:  [61-73] 72 (09/11 0641) Resp:  [16-20] 18 (09/11 0641) BP: (104-122)/(57-63) 118/62 (09/11 0641) SpO2:  [98 %-100 %] 99 % (09/11 0310)  Physical Exam:  General: alert and cooperative Lochia: appropriate Uterine Fundus: firm Incision: healing well, rash resolved DVT Evaluation: No evidence of DVT seen on physical exam. Negative Homan's sign. No cords or calf tenderness. No significant calf/ankle edema.   Recent Labs  07/14/16 0830 07/15/16 0718  HGB 12.2 11.0*  HCT 35.4* 31.9*    Assessment/Plan: Status post Cesarean section. Doing well postoperatively.  Continue current care.  Taylen Wendland G 07/16/2016, 8:19 AM

## 2016-07-16 NOTE — Progress Notes (Signed)
UR chart review completed.  

## 2016-07-16 NOTE — Lactation Note (Signed)
This note was copied from a baby's chart. Lactation Consultation Note Follow up visit at 42 hours of age.  Previous attempt to visit, mom had visitors.  Baby is 2028w2d at 6#1oz with 5% weight loss over night.  Baby has had 9 feedings with some supplement of formula and 6 voids and 8 stools.   Last feeding recorded was 2ml supplement 3 hours ago and previous feeding of 15 minutes 6 hours ago. FOB holding baby and reports baby has been sleepy requesting assist with waking baby.  LC encouraged FOB to undress baby to allow STS for latching.  LC encouraged mom to work on massage and hand expression.  Mom reports difficulty on right breast with nipple irregular shaped and larger than left.  Mom agrees to hand pump after a few minutes of hand expression and she reports not seeing any colostrum from right breast.  Hand pump used with instructions and good movement of nipple/tissue noted.  Mom denies discomfort with hand pump and a few small drops expressed.  Mom held baby in cross cradle hold at right breast.  Baby noted to hold upper lip tight and mom is not eager to stroke nipple to lips to open mouth.  LC offered to assist, baby opened mouth and LC assisted with bringing baby closer to breast for a deep latch.  Baby did not get a deep enough latch, although more than nipple deep.  Mom screamed before baby was able to get latched and pulled back making latch more shallow.  During screaming, mom arched her back and stiffened and then placed her hand on stomach, LC advised for mom to insert finger in baby's mouth and mom then quickly pulled baby off the breast without breaking suction first.  LC removed baby from mom's chest and held baby while MBU, RN, [name] and FOB calmed mom at bedside, and then RN left room.   Mom was able to verbalize that she was surprised by pain and then her stomach hurt.  Lc advised mom to not relatch to that breast at this time.  LC placed baby in moms arms for her to attempt an independent  latch on left breast.  Baby is sleepy and mom is not assertive with latch at this time.  LC reviewed supplement guidelines of 7-8112mls  And discussed pumping with mom as she continue to offer formula.  Mom reports that she does have a pump at home and plans to latch baby and pump while at home.  LC reported to RN to set mom up with DEBP.    Mom reports having baby latched to both breasts earlier today and doesn't feel baby is getting a deep enough latch.  LC encouraged mom to call RN for latch assist as needed.   RN to assess need for additional supplementation after weight check over night.    Patient Name: Robyn Wilkins ZOXWR'UToday's Date: 07/16/2016 Reason for consult: Follow-up assessment;Breast/nipple pain;Difficult latch   Maternal Data    Feeding Feeding Type: Breast Fed  LATCH Score/Interventions             Problem noted: Severe discomfort  Intervention(s): Skin to skin     Lactation Tools Discussed/Used Pump Review: Setup, frequency, and cleaning Initiated by:: JS Date initiated:: 07/16/16   Consult Status Consult Status: Follow-up Date: 07/17/16 Follow-up type: In-patient    Robyn Wilkins, Robyn Wilkins Robyn Wilkins 07/16/2016, 9:06 PM

## 2016-07-17 ENCOUNTER — Encounter (HOSPITAL_COMMUNITY): Payer: Self-pay | Admitting: Obstetrics and Gynecology

## 2016-07-17 NOTE — Progress Notes (Signed)
Subjective: Postpartum Day 2: Cesarean Delivery Patient reports incisional pain, tolerating PO, + flatus and no problems voiding.   Reports baby crying , not latching well, swelling LE, and painful Objective: Vital signs in last 24 hours: Temp:  [98 F (36.7 C)-98.1 F (36.7 C)] 98.1 F (36.7 C) (09/12 0500) Pulse Rate:  [79-82] 79 (09/12 0500) Resp:  [18] 18 (09/12 0500) BP: (108-117)/(66-72) 108/72 (09/12 0500)  Physical Exam:  General: alert, cooperative and mild distress, tearful Lochia: appropriate Uterine Fundus: firm Incision: healing well, no erythema or ecchymosis noted, heating pad applied DVT Evaluation: No evidence of DVT seen on physical exam. Negative Homan's sign. No cords or calf tenderness. Calf/Ankle edema is present.   Recent Labs  07/14/16 0830 07/15/16 0718  HGB 12.2 11.0*  HCT 35.4* 31.9*    Assessment/Plan: Status post Cesarean section. Doing well postoperatively.  Continue current care reassurance given, suggested pain meds, and lactation consult. Encouraged ambulaton and po fluids CURTIS,CAROL G 07/17/2016, 8:13 AM

## 2016-07-17 NOTE — Lactation Note (Signed)
This note was copied from a baby's chart. Lactation Consultation Note  Patient Name: Robyn Wilkins WJXBJ'YToday's Date: 07/17/2016 Reason for consult: Follow-up assessment;Breast/nipple pain;Difficult latch Per RN, Mom has been tearful today c/o pain with nursing baby. RN tried nipple shield but Mom reported discomfort and did not want to keep baby at breast.  Baby was circumcised this am and sleepy at this visit. Mom's nipples have superficial cracking bilateral. The left nipple is erect, the right nipple is erect but almost bifurcated. Mom reports baby biting at breast and FOB reports feeling the baby bite his finger with finger feeding. Baby does appear to have short labial frenulum along with short posterior lingual frenulum resulting in bowl shape to tongue with some decreased mobility. Attempted to latch baby without nipple shield and baby did latch, Mom tolerated the baby at breast but continued to have discomfort. Baby doing a lot of chewing at breast. Initiated #20 nipple shield to see if this more comfortable for Mom and if baby could develop a more nutritive suckling pattern. Baby still chewing but deeper latch obtained. Mom had discomfort but reported less discomfort. #20 nipple shield appeared to fit both breasts, scant amount of colostrum present in the nipple shield. Hand out regarding nipple shield use/care given to Mom.  Plan for feeding: Offer breast each feeding using #20 nipple shield, 8-12 times in 24 hours and with feeding ques.  If baby frantic and not able to latch, then give 5-10 ml appetizer with finger feeding and pre-loading nipple shield with curved tipped syringe. If baby will latch try to nurse both breasts 15-20 minutes,  but if too painful then nurse 1 breast, post pump for 15 minutes and supplement according to guidelines per hours of age. Then alternate this pattern next feeding so baby is on each breast at every other feeding. Look for breast milk in nipple shield with  feedings.  If latching baby becomes too stressful or painful then Mom to pump and supplement per guidelines given 30-60 ml each feeding. RN to set up DEBP for Mom to use. Stressed importance to Mom of pumping every 3 hours for 15 minutes to encourage milk production and to have EBM to supplement.  Care for sore nipples reviewed, advised to apply EBM. Comfort gels given with instructions to use with EBM but to alternate with coconut oil.  Encouraged to call for assist with feedings. Advised OP f/u after d/c home. Re-assured Mom that if latching becomes too painful/stressful, if she will pump and get her milk in and feed baby with EBM/formula as needed, we can continue to work on latch till improving.  Also discussed options of pump/bottle feed if this will work better for Robyn Wilkins. Mom reports this was her original plan was to pump/bottle during the day and BF at night. Mom does have DEBP at home.     Maternal Data    Feeding Feeding Type: Formula Length of feed: 20 min  LATCH Score/Interventions Latch: Repeated attempts needed to sustain latch, nipple held in mouth throughout feeding, stimulation needed to elicit sucking reflex. (initiated #20 nipple shield) Intervention(s): Adjust position;Assist with latch;Breast massage;Breast compression  Audible Swallowing: A few with stimulation  Type of Nipple: Everted at rest and after stimulation  Comfort (Breast/Nipple): Engorged, cracked, bleeding, large blisters, severe discomfort  Problem noted: Cracked, bleeding, blisters, bruises;Severe discomfort Interventions  (Cracked/bleeding/bruising/blister): Expressed breast milk to nipple Interventions (Mild/moderate discomfort): Comfort gels (alternate comfort gels with coconut oil)  Hold (Positioning): Assistance needed to correctly  position infant at breast and maintain latch. Intervention(s): Breastfeeding basics reviewed;Support Pillows;Position options;Skin to skin  LATCH Score:  5  Lactation Tools Discussed/Used Tools: Nipple Shields;Pump;Comfort gels Nipple shield size: 20;24 Breast pump type: Manual   Consult Status Consult Status: Follow-up Date: 07/18/16 Follow-up type: In-patient    Alfred Levins 07/17/2016, 4:34 PM

## 2016-07-17 NOTE — Lactation Note (Signed)
This note was copied from a baby's chart. Lactation Consultation Note Mom is supplementing w/formula after BF. Discussed w/RN the need for mom to post-pump for stimulation d/t weight of baby. RN offered DEBP, mom refused.  Patient Name: Robyn Wilkins WUJWJ'XToday's Date: 07/17/2016     Maternal Data    Feeding Feeding Type: Breast Milk with Formula added Length of feed: 20 min  LATCH Score/Interventions                      Lactation Tools Discussed/Used     Consult Status      Ariela Mochizuki G 07/17/2016, 4:36 AM

## 2016-07-17 NOTE — Progress Notes (Signed)
Trying to assist mom with breast feeding. Unable to latch the baby deep enough, had to stop latch with finger. Patient tearful. Talked with LC who was not able to assist patient at this time, but recommended nipple shield. Put #24 NS on right nipple and attempted latch.  Mother cried out with pain and removed baby. Mom crying saying "I don't want to do this. People want me to do this. I don't want to do this!" Told mom we will support her any way she wants to feed her baby. Husband also supportive of this. Patient crying and won't talk. Told her I would leave them alone to talk. Support offered again.

## 2016-07-18 LAB — TYPE AND SCREEN
ABO/RH(D): O NEG
ANTIBODY SCREEN: POSITIVE
DAT, IgG: NEGATIVE
UNIT DIVISION: 0
UNIT DIVISION: 0

## 2016-07-18 MED ORDER — OXYCODONE HCL 5 MG PO TABS: 5.0000 mg | ORAL_TABLET | ORAL | 0 refills | Status: DC | PRN

## 2016-07-18 MED ORDER — IBUPROFEN 600 MG PO TABS: 600.0000 mg | ORAL_TABLET | Freq: Four times a day (QID) | ORAL | 1 refills | Status: DC | PRN

## 2016-07-18 NOTE — Lactation Note (Signed)
This note was copied from a baby's chart. Lactation Consultation Note  Patient Name: Boy Nelida Goresmanda Mcmullan ZOXWR'UToday's Date: 07/18/2016 Reason for consult: Follow-up assessment;Breast/nipple pain;Difficult latch At this time Mom wants to pump/bottle feed baby till nipples heal. Mom reports she plans to work on latch once nipples feel better, plans to use nipple shield. LC advised Mom with pumping to continue to pump every 3 hours for 15 minutes to encourage milk production, prevent engorgement and protect milk supply. Parents have been supplementing using 5 fr feeding tube/syringe with some feedings. Parents report baby has been spitty with some feedings. Demonstrated paced feeding using a bottle. Advised to use Dr. Manson PasseyBrown #1 bottle at home. Advised to give partial feeding, burp baby, then finish feeding burping baby and keeping baby upright for 15-20 minutes. Parents are to supplement with 30-60 ml each feeding. Engorgement care reviewed if needed, refer to page 24 Baby N Me booklet, breast milk storage guidelines reviewed, refer to page 25 Baby N Me booklet. OP f/u scheduled for Tuesday, 07/24/16 at 4:00 to work on latch. Encouraged to call for questions/concerns.   Maternal Data    Feeding Feeding Type: Formula Nipple Type: Slow - flow  LATCH Score/Interventions                      Lactation Tools Discussed/Used Tools: Pump;Nipple Shields Nipple shield size: 20;24 Breast pump type: Double-Electric Breast Pump   Consult Status Consult Status: Complete Date: 07/18/16 Follow-up type: In-patient    Alfred LevinsGranger, Thomasa Heidler Ann 07/18/2016, 9:03 AM

## 2016-07-18 NOTE — Progress Notes (Signed)
I agree with the above assesement.

## 2016-07-18 NOTE — Progress Notes (Signed)
Per patient records, Tdap was discussed and VIS was given.  No actual documentation of vaccination given.  Patient informed and told to follow up in office about receiving the Tdap vaccination.

## 2016-07-18 NOTE — Discharge Summary (Signed)
Obstetric Discharge Summary Reason for Admission: induction of labor Prenatal Procedures: ultrasound Intrapartum Procedures: cesarean: low cervical, transverse Postpartum Procedures: none Complications-Operative and Postpartum: none Hemoglobin  Date Value Ref Range Status  07/15/2016 11.0 (L) 12.0 - 15.0 g/dL Final   HCT  Date Value Ref Range Status  07/15/2016 31.9 (L) 36.0 - 46.0 % Final    Physical Exam:  General: alert and cooperative Lochia: appropriate Uterine Fundus: firm Incision: healing well DVT Evaluation: No evidence of DVT seen on physical exam. Negative Homan's sign. No cords or calf tenderness.  Discharge Diagnoses: Term Pregnancy-delivered  Discharge Information: Date: 07/18/2016 Activity: pelvic rest Diet: routine Medications: PNV, Ibuprofen and Percocet Condition: stable Instructions: refer to practice specific booklet Discharge to: home   Newborn Data: Live born female  Birth Weight: 6 lb 5.9 oz (2889 g) APGAR: 8, 9  Home with mother.  Eddison Searls G 07/18/2016, 8:23 AM

## 2016-07-24 ENCOUNTER — Ambulatory Visit (HOSPITAL_COMMUNITY): Admit: 2016-07-24 | Payer: BC Managed Care – PPO

## 2017-05-19 ENCOUNTER — Encounter (HOSPITAL_COMMUNITY): Payer: Self-pay

## 2019-04-11 ENCOUNTER — Encounter (HOSPITAL_COMMUNITY): Payer: Self-pay

## 2019-04-11 ENCOUNTER — Ambulatory Visit (HOSPITAL_COMMUNITY)
Admission: EM | Admit: 2019-04-11 | Discharge: 2019-04-11 | Disposition: A | Discharge status: Home / Self Care | Payer: BC Managed Care – PPO | Attending: Family Medicine | Admitting: Family Medicine

## 2019-04-11 ENCOUNTER — Other Ambulatory Visit: Payer: Self-pay

## 2019-04-11 DIAGNOSIS — G243 Spasmodic torticollis: Secondary | ICD-10-CM

## 2019-04-11 MED ORDER — CYCLOBENZAPRINE HCL 10 MG PO TABS: ORAL_TABLET | ORAL | 0 refills | Status: AC

## 2019-04-11 MED ORDER — IBUPROFEN 800 MG PO TABS: 800.0000 mg | ORAL_TABLET | Freq: Three times a day (TID) | ORAL | 0 refills | Status: AC

## 2019-04-11 MED ORDER — KETOROLAC TROMETHAMINE 60 MG/2ML IM SOLN
60.0000 mg | Freq: Once | INTRAMUSCULAR | Status: AC
  Administered 2019-04-11: 60 mg via INTRAMUSCULAR

## 2019-04-11 MED ORDER — KETOROLAC TROMETHAMINE 60 MG/2ML IM SOLN
INTRAMUSCULAR | Status: AC
  Filled 2019-04-11: qty 2

## 2019-04-11 NOTE — ED Triage Notes (Signed)
Pt states she has left sided neck pain. Pt states this started yesterday. Pt states the pain is a 10. Pt states the pain got worst last night.

## 2019-04-11 NOTE — Discharge Instructions (Addendum)
Be aware, medications prescribed may cause drowsiness. Please do not drive, operate heavy machinery or make important decisions while on this medication, it can cloud your judgement.

## 2019-04-11 NOTE — ED Provider Notes (Signed)
[facility]   [id number] 04/11/19 Arrival Time: 1005  ASSESSMENT & PLAN:  1. Torticollis, spasmodic    No signs of serious head, neck, or back injury. Neurological exam without focal deficits. No signs of infection/mastoiditis.  Meds ordered this encounter  Medications  . ketorolac (TORADOL) injection 60 mg  . ibuprofen (ADVIL) 800 MG tablet    Sig: Take 1 tablet (800 mg total) by mouth 3 (three) times daily with meals.    Dispense:  21 tablet    Refill:  0  . cyclobenzaprine (FLEXERIL) 10 MG tablet    Sig: Take 1 tablet by mouth 3 times daily as needed for muscle spasm. Warning: May cause drowsiness.    Dispense:  21 tablet    Refill:  0   Medication sedation precautions given.  Ensure adequate ROM as tolerated.  Follow-up Information    [facility]   Specialty:  Urgent Care Why:  As needed. Contact information: [address] [place] [phone]         Reviewed expectations re: course of current medical issues. Questions answered. Outlined signs and symptoms indicating need for more acute intervention. Patient verbalized understanding. After Visit Summary given.  SUBJECTIVE: History from: patient. Robyn Wilkins is a 43 y.o. female who presents with complaint of L-sided neck pain. First noticed yesterday evening. Did work out in gym yesterday. No injury/trauma reported. Gradual onset of now persistent pain. Worse with any neck movement (L>R). Even feels pain when opening jaw. Afebrile. No n/v. No extremity sensation changes or weakness. No h/o similar. No headache or visual changes or hearing changes. No new medications. Took one of her husband's Flexeril; helped for about two hours. Difficulty sleeping secondary to pain. No CP/SOB. No analgesics taken.  ROS: As per HPI. All other systems negative   OBJECTIVE:  Vitals:   04/11/19 1028 04/11/19 1030  BP:  96/66  Pulse:  76  Resp:  16   Temp:  98.1 F (36.7 C)  TempSrc:  Oral  SpO2:  100%  Weight: 63.5 kg    General appearance: alert; no distress HEENT: normocephalic; atraumatic; conjunctivae normal; no orbital bruising or tenderness to palpation; throat and oral mucosa normal; no post-auricular tenderness or swelling Neck: supple with FROM but moves slowly; no midline tenderness; vague tenderness over L posterior neck musculature Lungs: clear to auscultation bilaterally; unlabored Heart: regular rate and rhythm Extremities: moves all extremities normally Skin: warm and dry; without open wounds Neurologic: normal gait Psychological: alert and cooperative; normal mood and affect  Allergies  Allergen Reactions  . Gluten Diarrhea and Other (See Comments)    Reaction:  Abdominal cramping    Past Medical History:  Diagnosis Date  . Endometriosis   . 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 Urinary incontinence    Past Surgical History:  Procedure Laterality Date  . CESAREAN SECTION N/A 07/15/2016   Procedure: CESAREAN SECTION;  Surgeon: Harold HedgeJames Tomblin, MD;  [facility];  Service: Obstetrics;  Laterality: N/A;  . DILATION AND EVACUATION  09/07/2011   Procedure: DILATATION AND EVACUATION (D&E);  Surgeon: Roselle LocusJames E Tomblin II;  Location: [facility];  Service: Gynecology;  Laterality: N/A;  . LAPAROSCOPY     for endometriosis   FH: Question of HTN.  Social History   Socioeconomic History  . Marital status: Married    Spouse  name: Not on file  . Number of children: Not on file  . Years of education: Not on file  . Highest education level: Not on file  Occupational History  . Not on file  Social Needs  . Financial resource strain: Not on file  . Food insecurity:    Worry: Not on file    Inability: Not on file  . Transportation needs:    Medical: Not on file    Non-medical: Not on file  Tobacco Use  . Smoking status:  Never Smoker  . Smokeless tobacco: Never Used  Substance and Sexual Activity  . Alcohol use: Yes    Comment: occassional  . Drug use: No  . Sexual activity: Yes  Lifestyle  . Physical activity:    Days per week: Not on file    Minutes per session: Not on file  . Stress: Not on file  Relationships  . Social connections:    Talks on phone: Not on file    Gets together: Not on file    Attends religious service: Not on file    Active member of club or organization: Not on file    Attends meetings of clubs or organizations: Not on file    Relationship status: Not on file  Other Topics Concern  . Not on file  Social History Narrative  . Not on file          Vanessa Kick, MD 04/11/19 1058

## 2019-09-24 ENCOUNTER — Other Ambulatory Visit: Payer: Self-pay

## 2019-09-24 DIAGNOSIS — Z20822 Contact with and (suspected) exposure to covid-19: Secondary | ICD-10-CM

## 2019-09-27 LAB — NOVEL CORONAVIRUS, NAA: SARS-CoV-2, NAA: NOT DETECTED
